# Patient Record
Sex: Female | Born: 1965 | Race: Black or African American | Hispanic: No | Marital: Married | Smoking: Never smoker
Health system: Southern US, Community
[De-identification: ages and names within clinical notes are randomized; demographics above are authoritative.]

## PROBLEM LIST (undated history)

## (undated) DIAGNOSIS — J189 Pneumonia, unspecified organism: Secondary | ICD-10-CM

## (undated) DIAGNOSIS — T8859XA Other complications of anesthesia, initial encounter: Secondary | ICD-10-CM

## (undated) DIAGNOSIS — M1990 Unspecified osteoarthritis, unspecified site: Secondary | ICD-10-CM

## (undated) DIAGNOSIS — G43909 Migraine, unspecified, not intractable, without status migrainosus: Secondary | ICD-10-CM

## (undated) DIAGNOSIS — E119 Type 2 diabetes mellitus without complications: Secondary | ICD-10-CM

## (undated) DIAGNOSIS — I10 Essential (primary) hypertension: Secondary | ICD-10-CM

## (undated) HISTORY — PX: TONSILLECTOMY: SUR1361

## (undated) HISTORY — PX: THYROID SURGERY: SHX805

## (undated) HISTORY — PX: TUBAL LIGATION: SHX77

---

## 1997-09-12 ENCOUNTER — Other Ambulatory Visit: Admission: RE | Admit: 1997-09-12 | Discharge: 1997-09-12 | Payer: Self-pay | Admitting: Obstetrics and Gynecology

## 1997-11-21 ENCOUNTER — Encounter: Admission: RE | Admit: 1997-11-21 | Discharge: 1997-11-21 | Payer: Self-pay | Admitting: *Deleted

## 1997-12-17 ENCOUNTER — Emergency Department (HOSPITAL_COMMUNITY): Admission: EM | Admit: 1997-12-17 | Discharge: 1997-12-17 | Payer: Self-pay

## 2002-06-29 ENCOUNTER — Emergency Department (HOSPITAL_COMMUNITY): Admission: EM | Admit: 2002-06-29 | Discharge: 2002-06-29 | Payer: Self-pay | Admitting: Emergency Medicine

## 2004-05-08 ENCOUNTER — Encounter (HOSPITAL_COMMUNITY): Admission: RE | Admit: 2004-05-08 | Discharge: 2004-08-06 | Payer: Self-pay | Admitting: Family Medicine

## 2004-05-20 ENCOUNTER — Ambulatory Visit: Payer: Self-pay | Admitting: "Endocrinology

## 2004-05-29 ENCOUNTER — Ambulatory Visit (HOSPITAL_COMMUNITY): Admission: RE | Admit: 2004-05-29 | Discharge: 2004-05-29 | Payer: Self-pay | Admitting: "Endocrinology

## 2004-05-29 ENCOUNTER — Encounter (INDEPENDENT_AMBULATORY_CARE_PROVIDER_SITE_OTHER): Payer: Self-pay | Admitting: *Deleted

## 2004-05-29 IMAGING — US US BIOPSY
1 series · 12 of 12 positions shown · non-contrast
Comparison: none

CLINICAL DATA: Multiple nodules within the right thyroid found on ultrasound films from [HOSPITAL].  Gradual weight gain.  
 ULTRASOUND GUIDED THYROID BIOPSY:

[Series 1: unknown · 0.09mm/px · 12 of 12 slices shown]
[im 1/12]
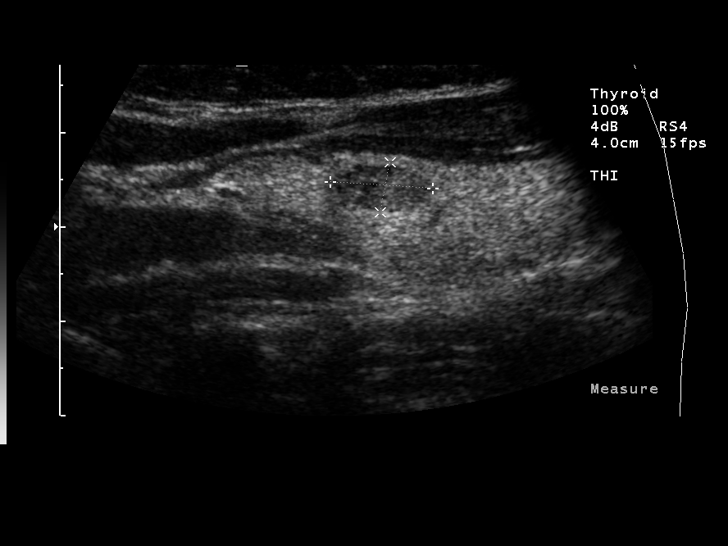
[im 2/12]
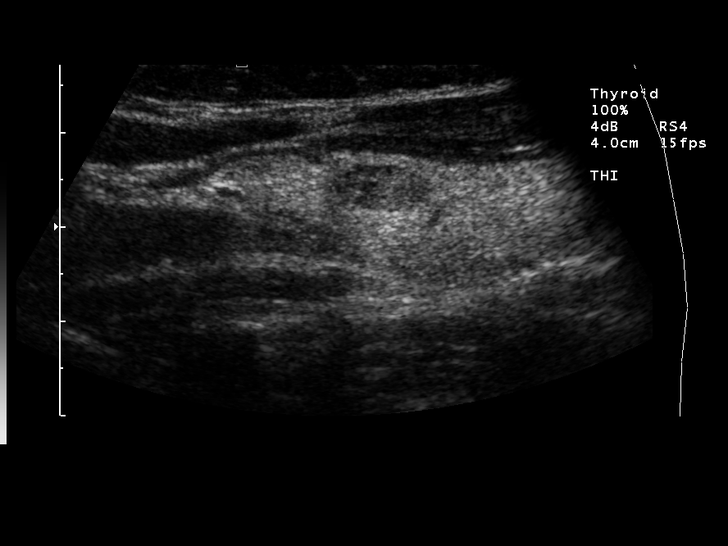
[im 3/12]
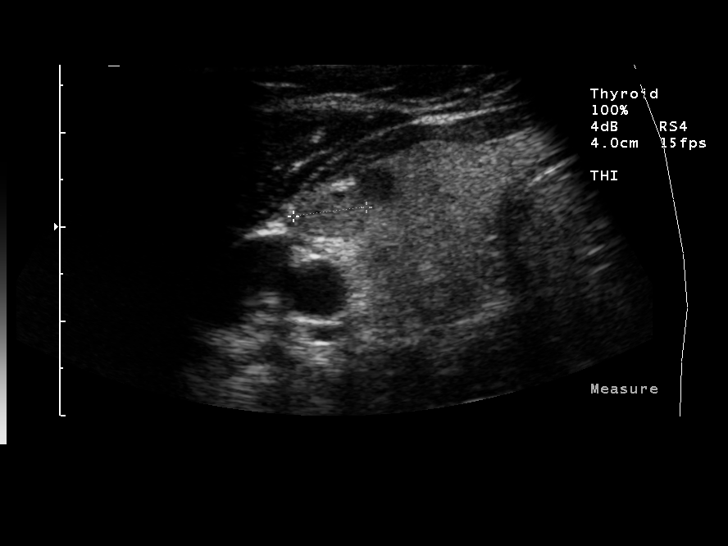
[im 4/12]
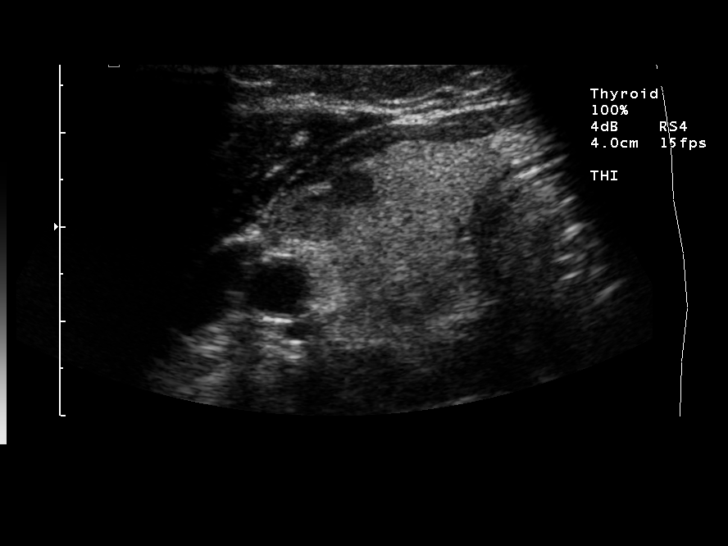
[im 5/12]
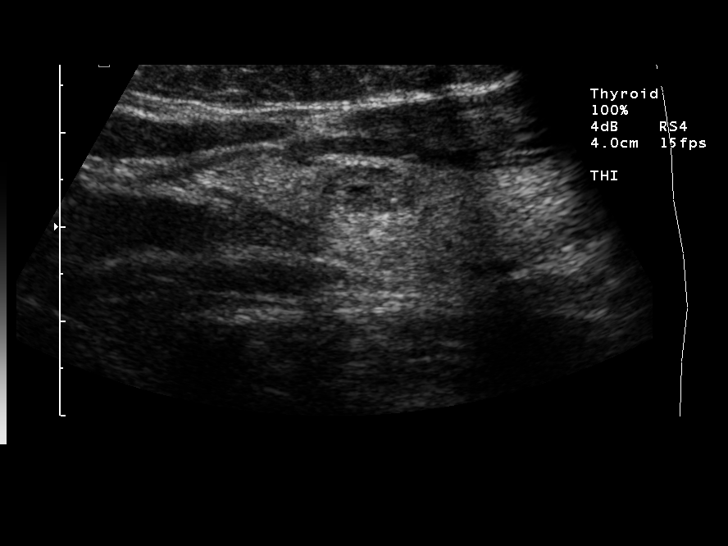
[im 6/12]
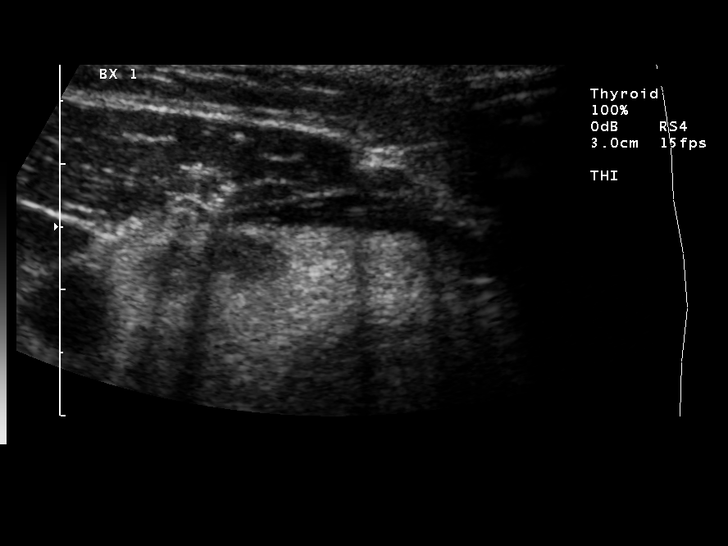
[im 7/12]
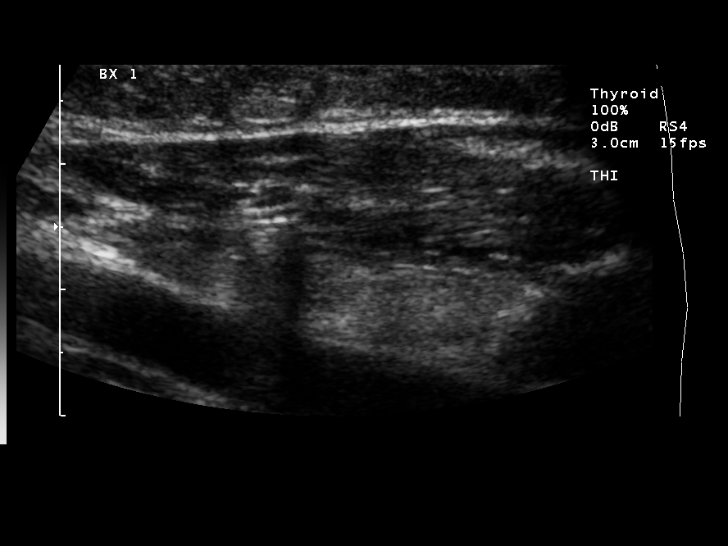
[im 8/12]
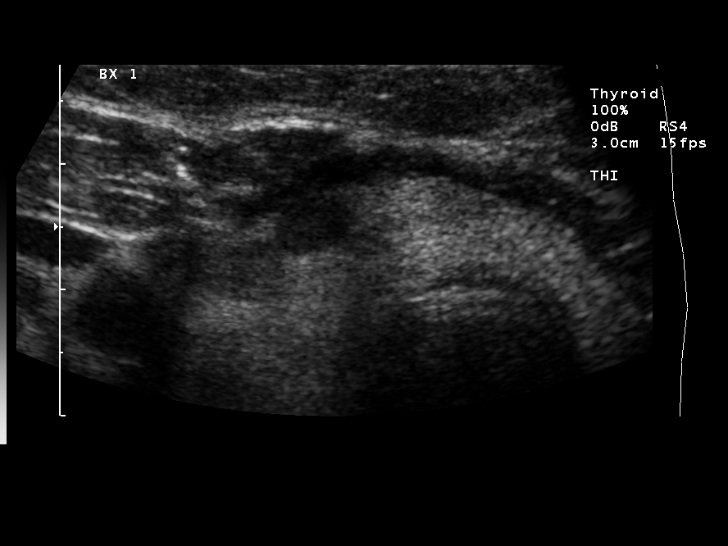
[im 9/12]
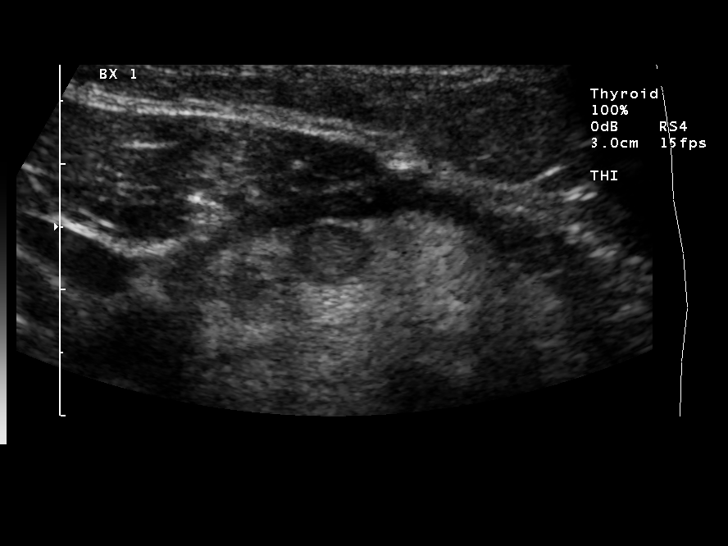
[im 10/12]
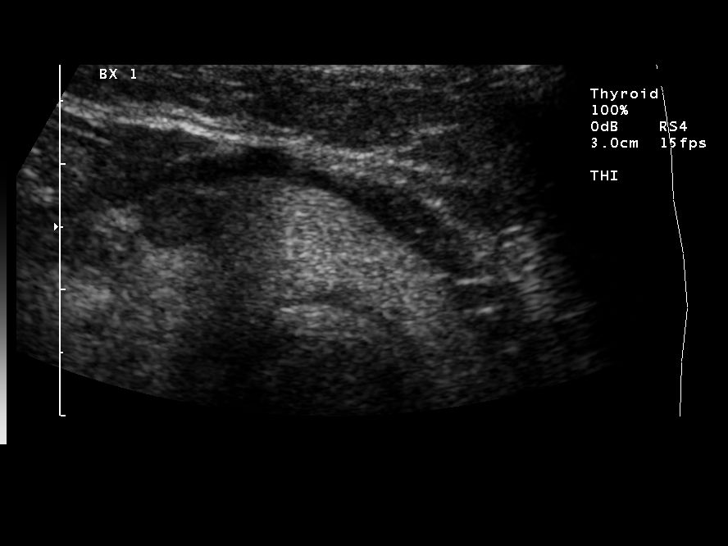
[im 11/12]
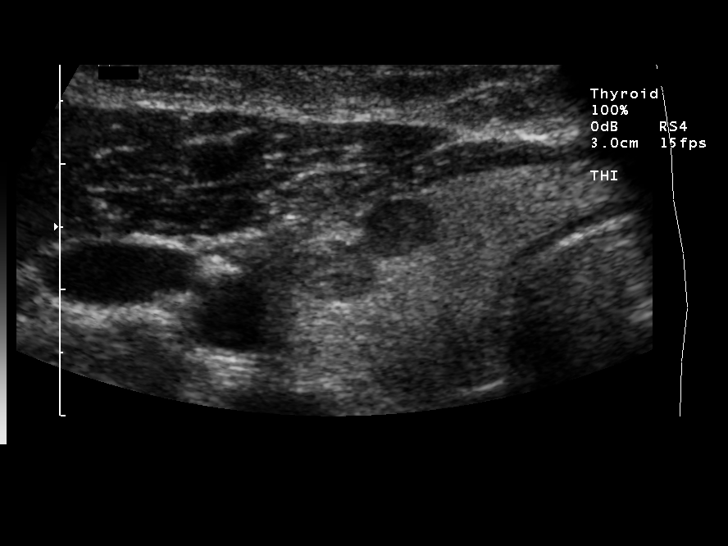
[im 12/12]
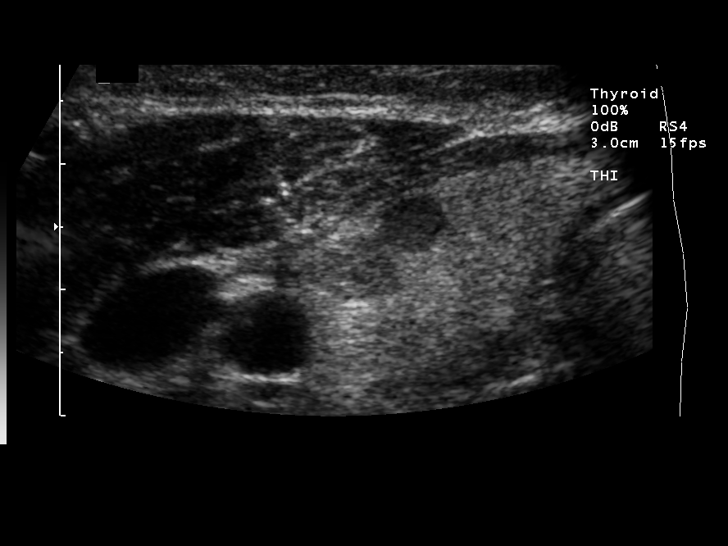

[12 of 12 positions shown; findings below may reference images not displayed]

FINDINGS: An ultrasound guided thyroid biopsy was thoroughly discussed with the patient and questions were answered.  Risks and benefits of the procedure were also discussed.  Risks specifically discussed included bleeding, bruising, infection and risk of injury to adjacent blood vessels and nerves.  The patient understands and wishes to proceed.  Verbal and written consent was obtained. 
 After the patient was prepped and draped in the normal sterile fashion, 1 percent lidocaine was used for local anesthesia.  Using direct ultrasound guidance, give passes were made using a 25-gauge hypodermic needle into the dominant nodule within the right lobe of the thyroid.  The specimens were given to cytology for further analysis.  The patient tolerated the procedure well and there were no immediate complications.  No hematoma was identified postprocedure.  Radiologist present for the procedure was Dr. MONTXO.
IMPRESSION: Successful ultrasound guided fine needle aspiration of the right lobe of the thyroid.

## 2004-09-30 ENCOUNTER — Ambulatory Visit: Payer: Self-pay | Admitting: "Endocrinology

## 2004-10-30 ENCOUNTER — Ambulatory Visit (HOSPITAL_COMMUNITY): Admission: RE | Admit: 2004-10-30 | Discharge: 2004-10-30 | Payer: Self-pay | Admitting: "Endocrinology

## 2005-01-06 ENCOUNTER — Other Ambulatory Visit: Admission: RE | Admit: 2005-01-06 | Discharge: 2005-01-06 | Payer: Self-pay | Admitting: Family Medicine

## 2005-03-31 ENCOUNTER — Ambulatory Visit: Payer: Self-pay | Admitting: "Endocrinology

## 2005-07-06 ENCOUNTER — Ambulatory Visit: Payer: Self-pay | Admitting: "Endocrinology

## 2005-07-07 ENCOUNTER — Encounter: Admission: RE | Admit: 2005-07-07 | Discharge: 2005-07-07 | Payer: Self-pay | Admitting: "Endocrinology

## 2005-07-07 IMAGING — US US SOFT TISSUE HEAD/NECK
1 series · 13 of 25 positions shown · non-contrast
Comparison: This is correlated with nuclear medicine thyroid scan dated [DATE] and also previous thyroid ultrasound from [HOSPITAL] dated [DATE].

CLINICAL DATA: Attention thyroid nodule.
 THYROID ULTRASOUND:
TECHNIQUE: Ultrasound examination of the thyroid gland and adjacent soft tissue structures was performed.

[Series 1: unknown · 0.09mm/px · 13 of 33 slices shown]
[im 1/33]
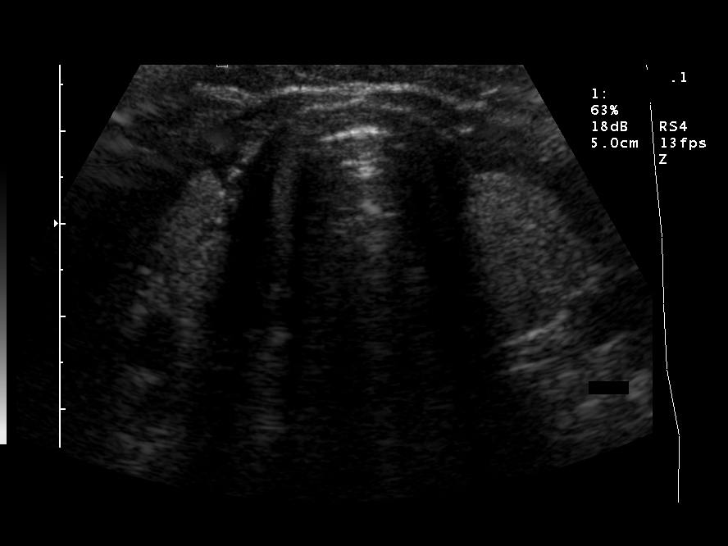
[im 3/33]
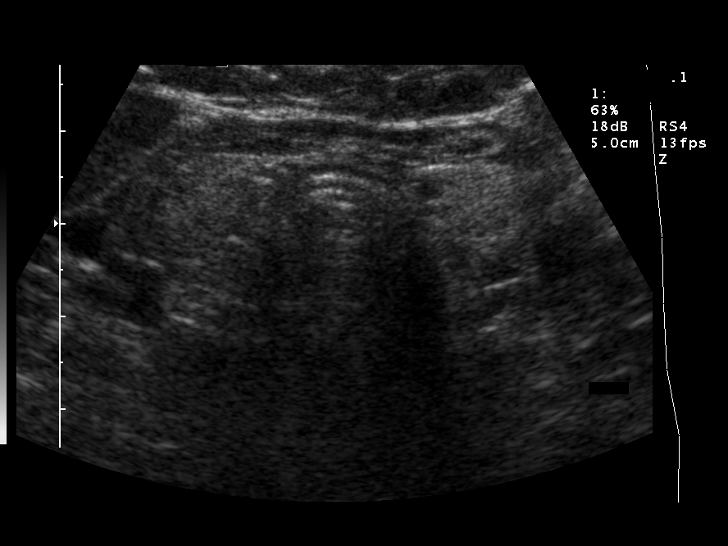
[im 6/33]
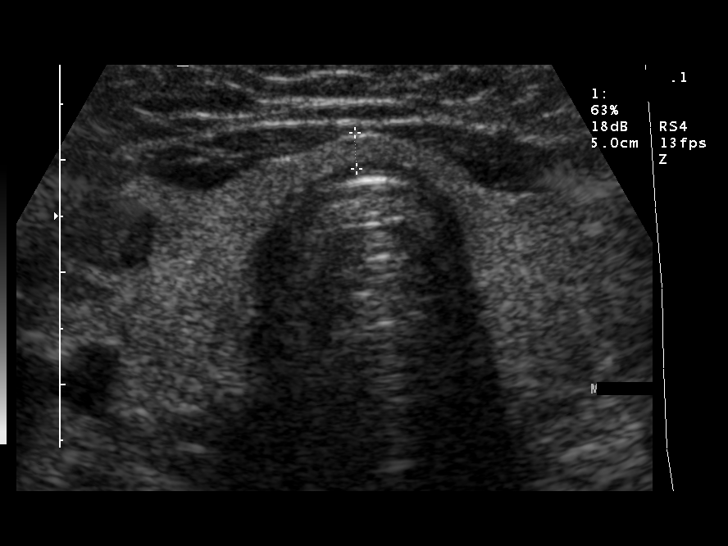
[im 9/33]
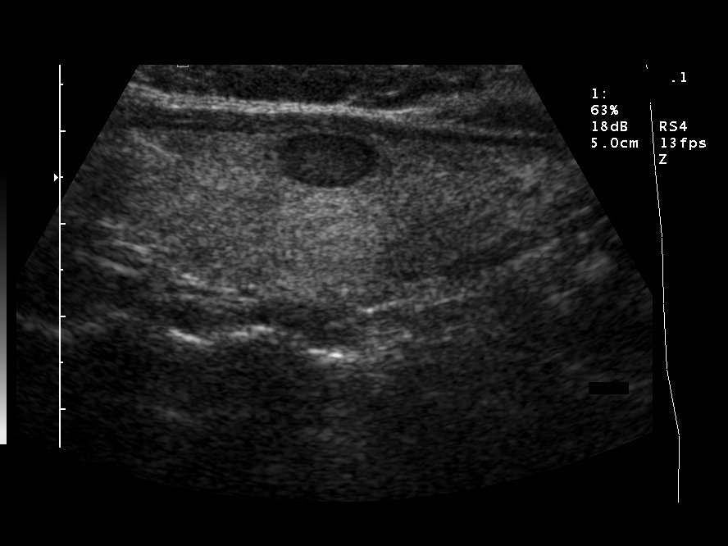
[im 11/33]
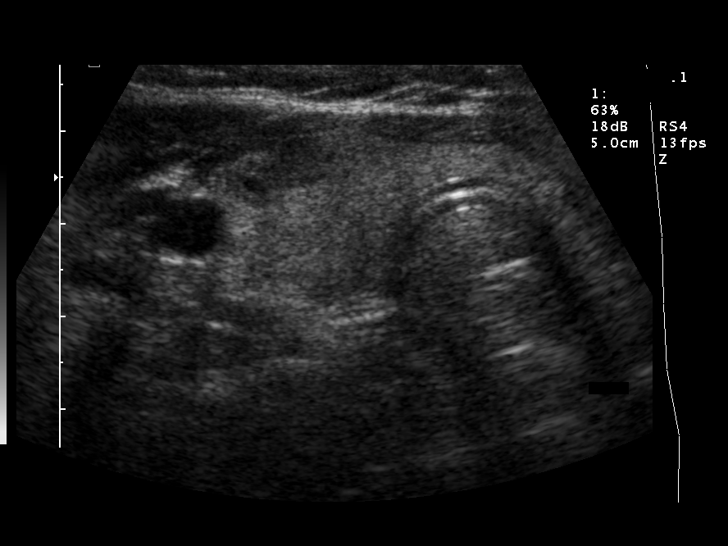
[im 14/33]
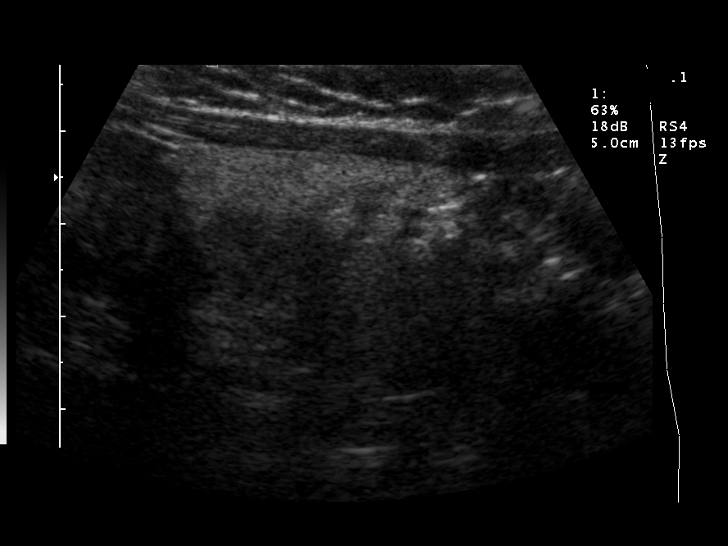
[im 17/33]
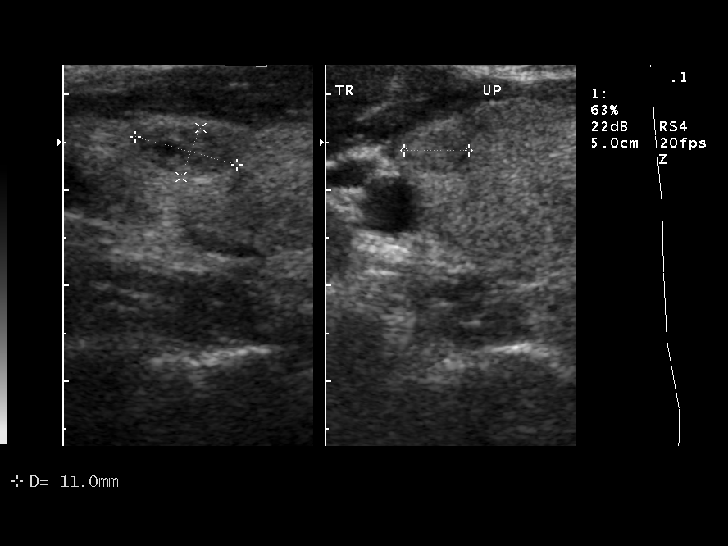
[im 19/33]
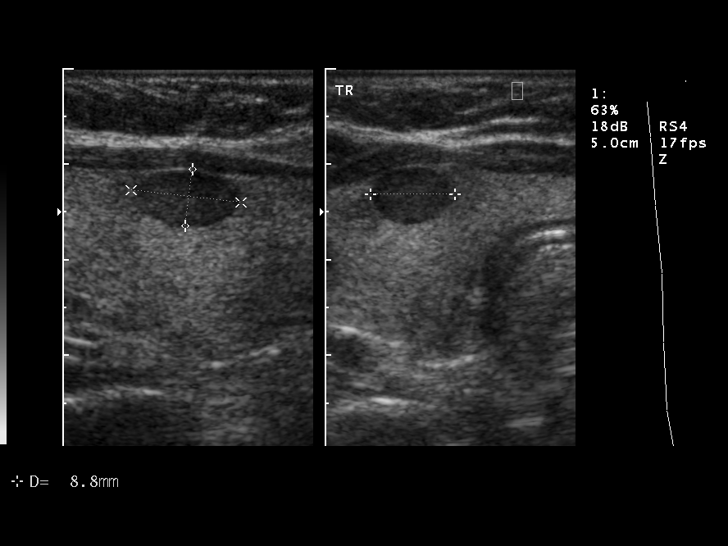
[im 22/33]
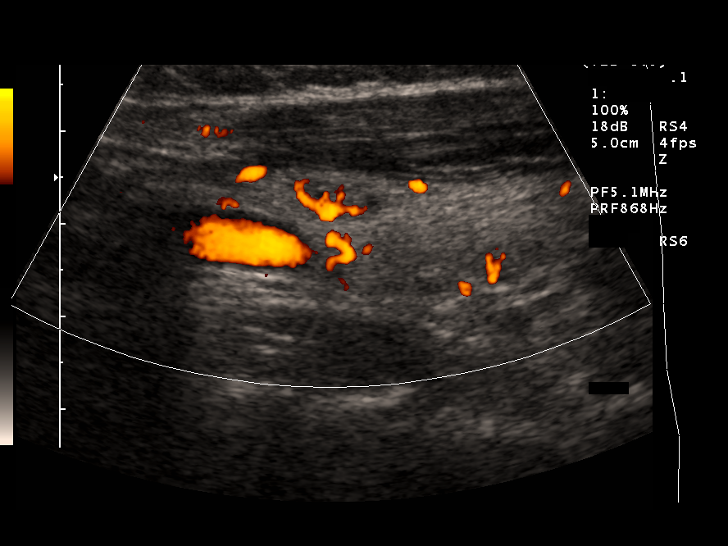
[im 25/33]
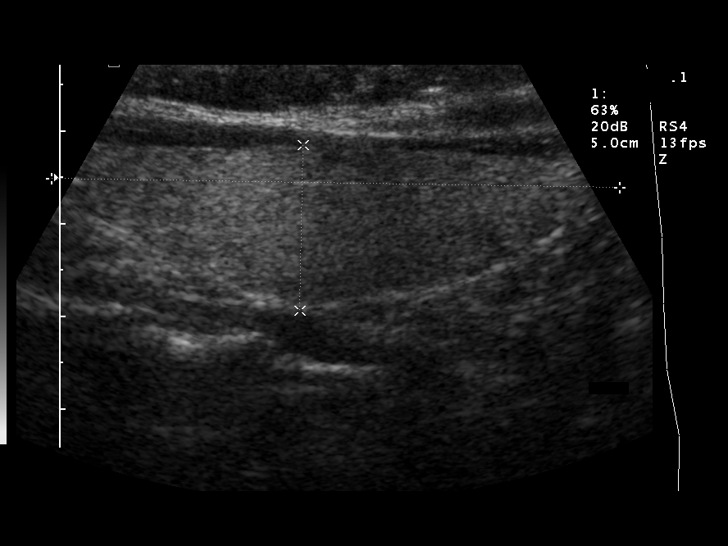
[im 27/33]
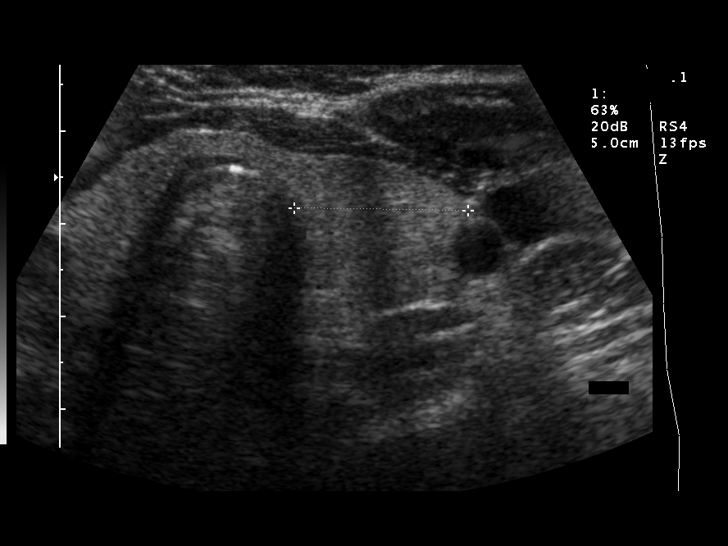
[im 30/33]
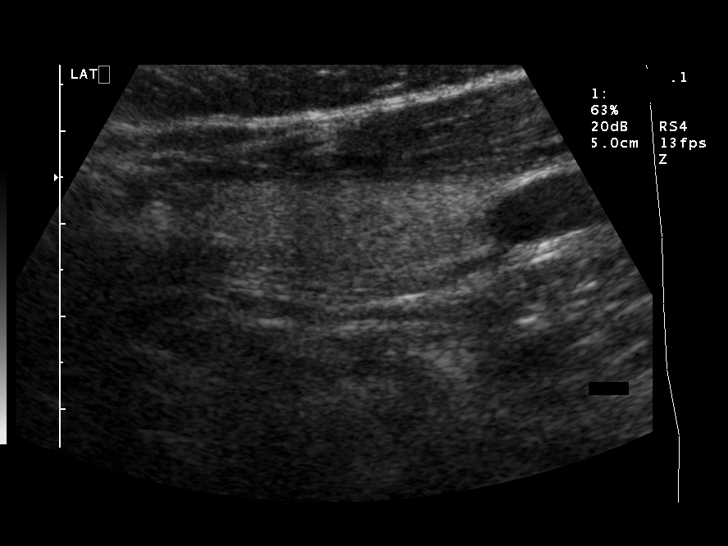
[im 33/33]
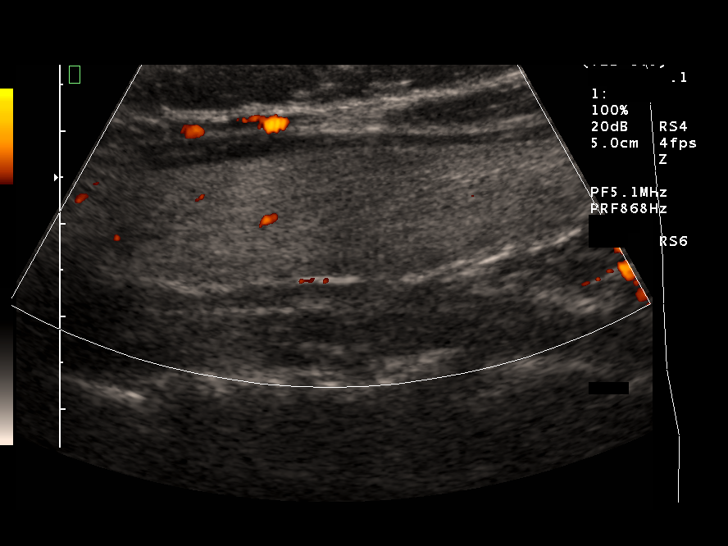

[13 of 25 positions shown; findings below may reference images not displayed]

FINDINGS: The right lobe of the thyroid measures 6.0 x 2.1 x 2.1 cm in size and the left lobe 6.1 x 1.8 x 1.9 cm in size.  The isthmus measures 3 mm in AP dimension.  There are two nodules associated with the right lobe of the thyroid.  These are adjacent to one another.  The more medially located nodule measures 1.2 x .6 x .9 cm in size and the more laterally located nodule 1.1 x .6 x .7 cm in size.  These have slightly increased in size intervally.  There is no nodule within the left lobe of the thyroid.  The small nodule seen associated with the isthmus slightly to the left of the midline on the prior study is not definitely seen on today?s study.  When correlating the ultrasound study with the recent nuclear medicine examination the area of increased activity is associated with the more laterally located solid nodule within the mid to upper pole portion of the right lobe of the thyroid.
IMPRESSION: Two solid nodules associated with the upper pole of the right lobe of the thyroid which have slightly increased in size intervally.  The more laterally located nodule is associated with increased activity on the nuclear medicine thyroid scan as discussed above.

## 2005-08-24 ENCOUNTER — Ambulatory Visit: Payer: Self-pay | Admitting: "Endocrinology

## 2005-08-27 ENCOUNTER — Encounter: Admission: RE | Admit: 2005-08-27 | Discharge: 2005-08-27 | Payer: Self-pay | Admitting: "Endocrinology

## 2005-09-21 ENCOUNTER — Other Ambulatory Visit: Admission: RE | Admit: 2005-09-21 | Discharge: 2005-09-21 | Payer: Self-pay | Admitting: Interventional Radiology

## 2005-09-21 ENCOUNTER — Encounter (INDEPENDENT_AMBULATORY_CARE_PROVIDER_SITE_OTHER): Payer: Self-pay | Admitting: Specialist

## 2005-09-21 ENCOUNTER — Encounter: Admission: RE | Admit: 2005-09-21 | Discharge: 2005-09-21 | Payer: Self-pay | Admitting: "Endocrinology

## 2005-09-21 IMAGING — US US BIOPSY
1 series · 10 of 10 positions shown · non-contrast
Comparison: none

CLINICAL DATA: History of right upper pole thyroid lobe nodules with previous aspiration in this area on [DATE] which revealed changes consistent with a nonneoplastic goiter.  Recent thyroid ultrasound on [DATE] revealed a slight interval increase in the size of the these two nodules.  The lateral right upper pole thyroid lobe nodule measures 1.1 x 0.6 x 0.7 cm.  The more medially located right upper pole thyroid nodule measures 1.2 x 0.6 x 0.9 cm.  In addition, the more laterally   located nodule is associated with increased activity on nuclear medicine thyroid scan performed on [DATE].    Request is now made for fine needle aspiration of both right lateral and medial upper pole thyroid lobe nodules. 
FINE NEEDLE ASPIRATION OF RIGHT LATERAL UPPER POLE THYROID LOBE NODULE:

[Series 1: unknown · 0.06mm/px · 10 of 10 slices shown]
[im 1/10]
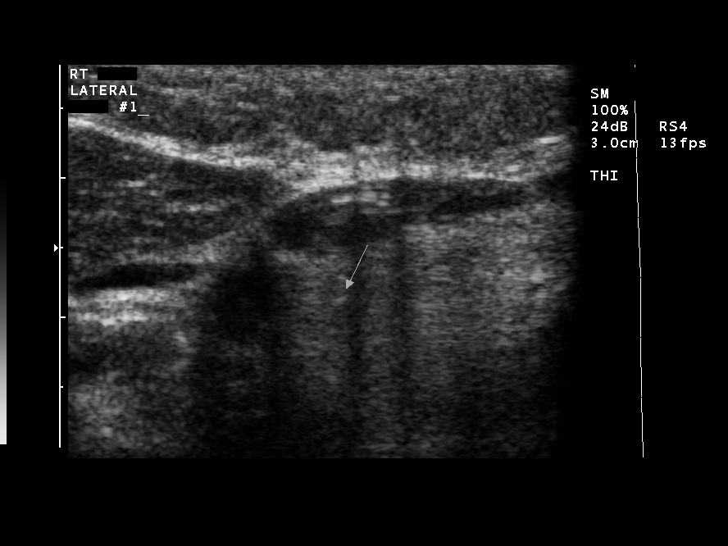
[im 2/10]
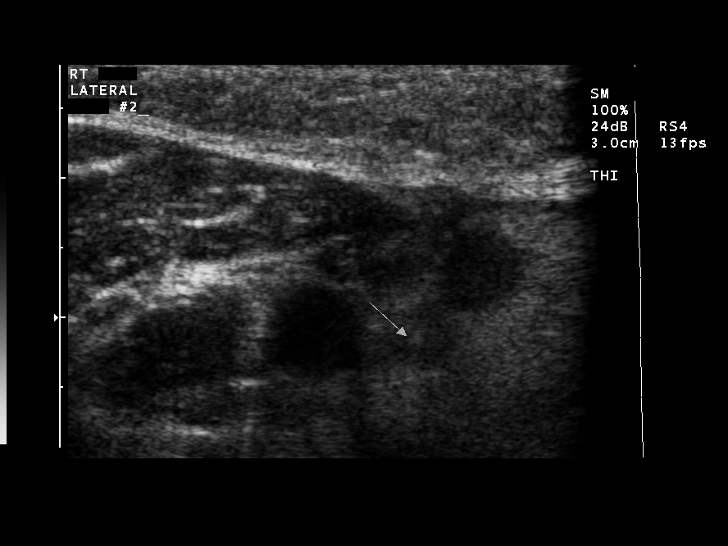
[im 3/10]
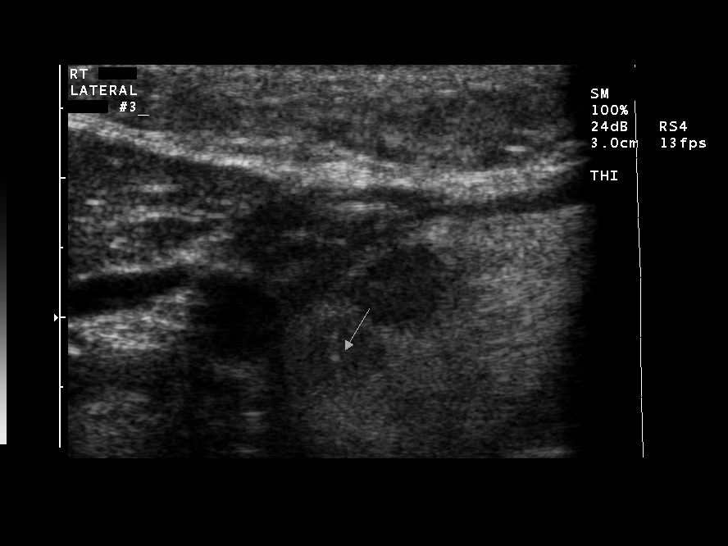
[im 4/10]
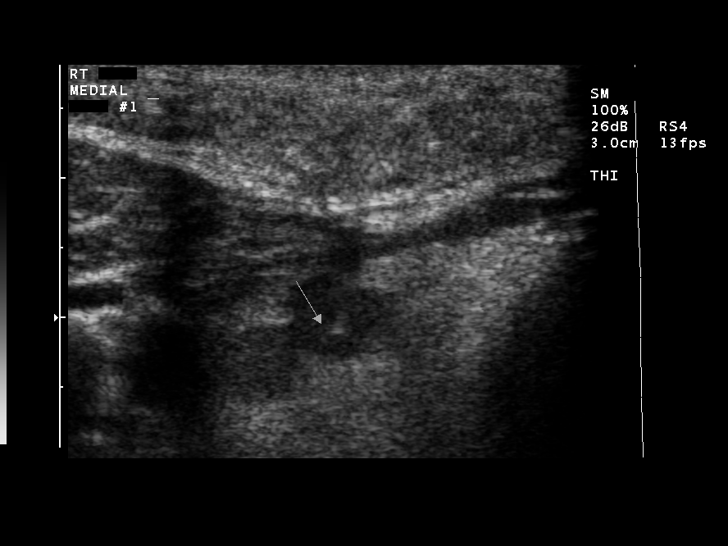
[im 5/10]
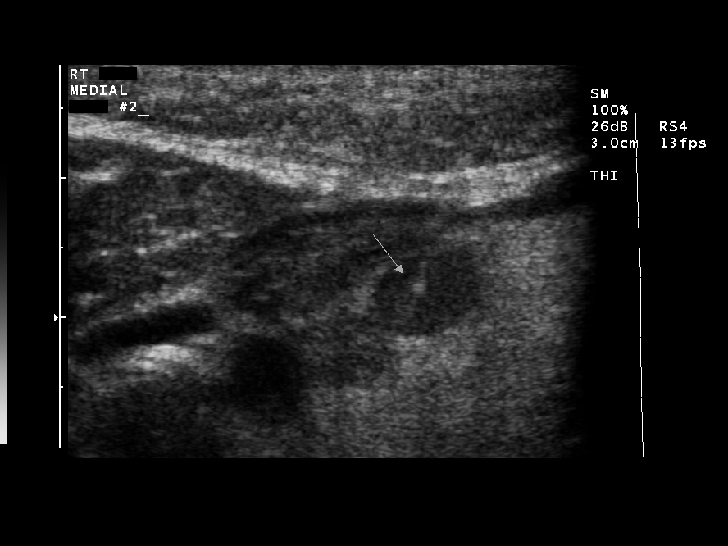
[im 6/10]
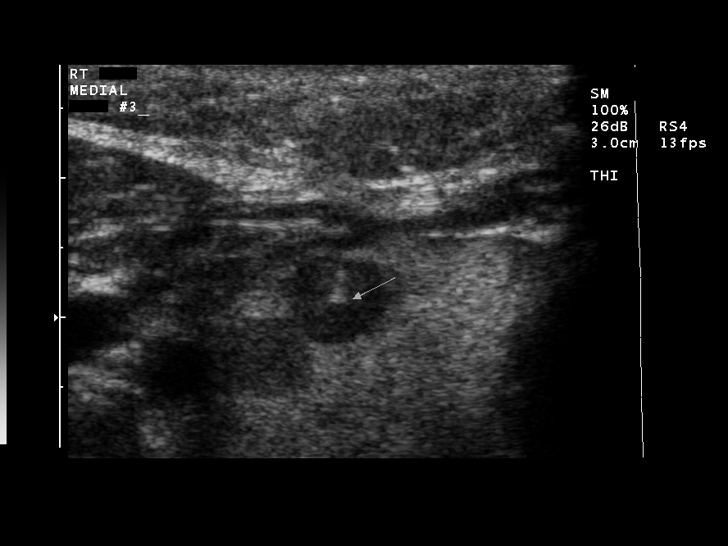
[im 7/10]
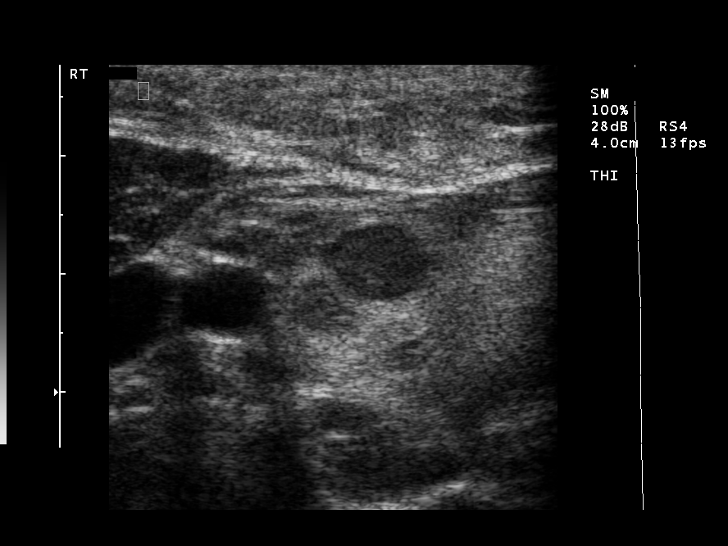
[im 8/10]
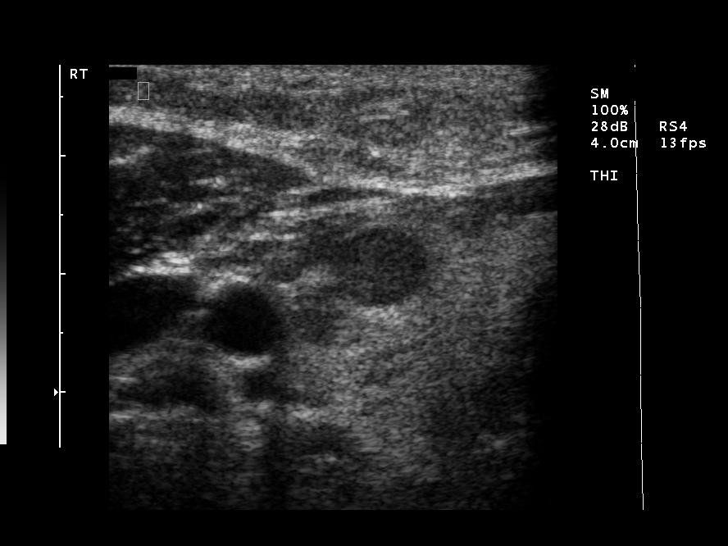
[im 9/10]
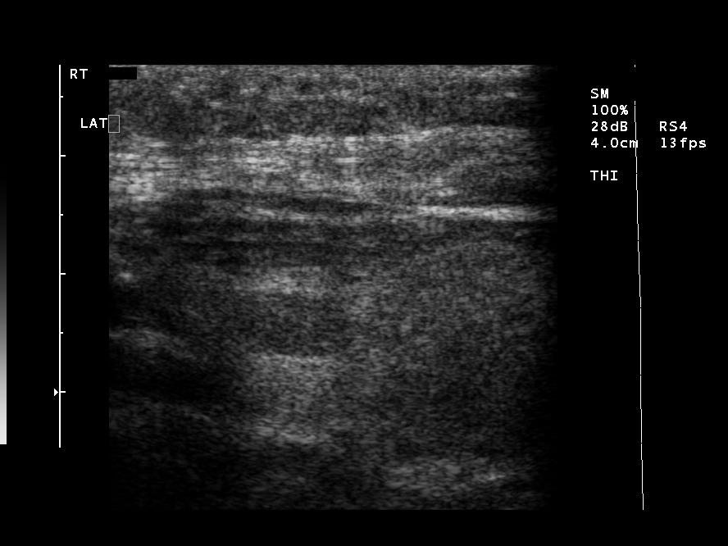
[im 10/10]
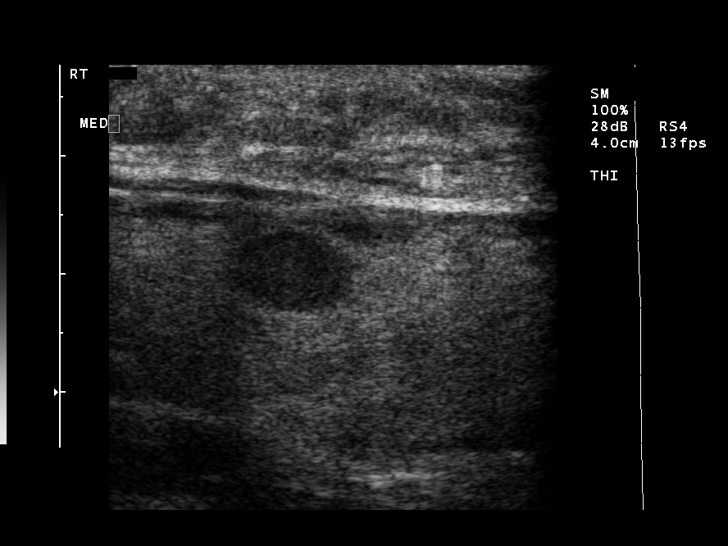

[10 of 10 positions shown; findings below may reference images not displayed]

FINDINGS: The above procedure was thoroughly discussed with the patient and written informed consent was obtained.
Ultrasound was then performed to localize and mark an adequate site for the biopsy.  The patient was then prepped and draped in a normal sterile fashion, and  1% Lidocaine was used for local anesthesia.   direct ultrasound guidance, three passes were made using  25 gauge hypodermic needles into the nodule located within the  lateral upper pole of the right thyroid lobe.  Ultrasound confirmed placement of the needle on all three occasions.  The specimens were given to pathology for further analysis.  Post procedure imaging demonstrated no hematoma or immediate complication. The patient tolerated the procedure well.
IMPRESSION: Successful ultrasound guided fine needle aspiration, nodule right lateral upper pole, right lobe of the thyroid.  Final pathology pending. 
FINE NEEDLE ASPIRATION OF RIGHT MEDIAL UPPER POLE THYROID LOBE NODULE:
FINDINGS: The above procedure was thoroughly discussed with the patient and written informed consent was obtained.
Ultrasound was then performed to localize and mark an adequate site for the biopsy.  The patient was then prepped and draped in a normal sterile fashion, and 1% Lidocaine was used for local anesthesia.   direct ultrasound guidance, three passes were made using  25 gauge hypodermic needles into the nodule located within the  medial upper pole of the right thyroid lobe.  Ultrasound confirmed placement of the needle on all three occasions.  The specimens were given to pathology for further analysis.  Post procedure imaging demonstrated no hematoma or immediate complication. The patient tolerated the procedure well.
IMPRESSION: Successful ultrasound guided fine needle aspiration, nodule right medial upper pole, right lobe of the thyroid.  Final pathology pending.

## 2006-01-15 IMAGING — CR DG CHEST 2V
2 series · 2 of 2 positions shown · non-contrast
Comparison: None.

CLINICAL DATA: Preop thyroid nodule.  
 CHEST - 2 VIEW:

[view not recorded (1 of 2)]
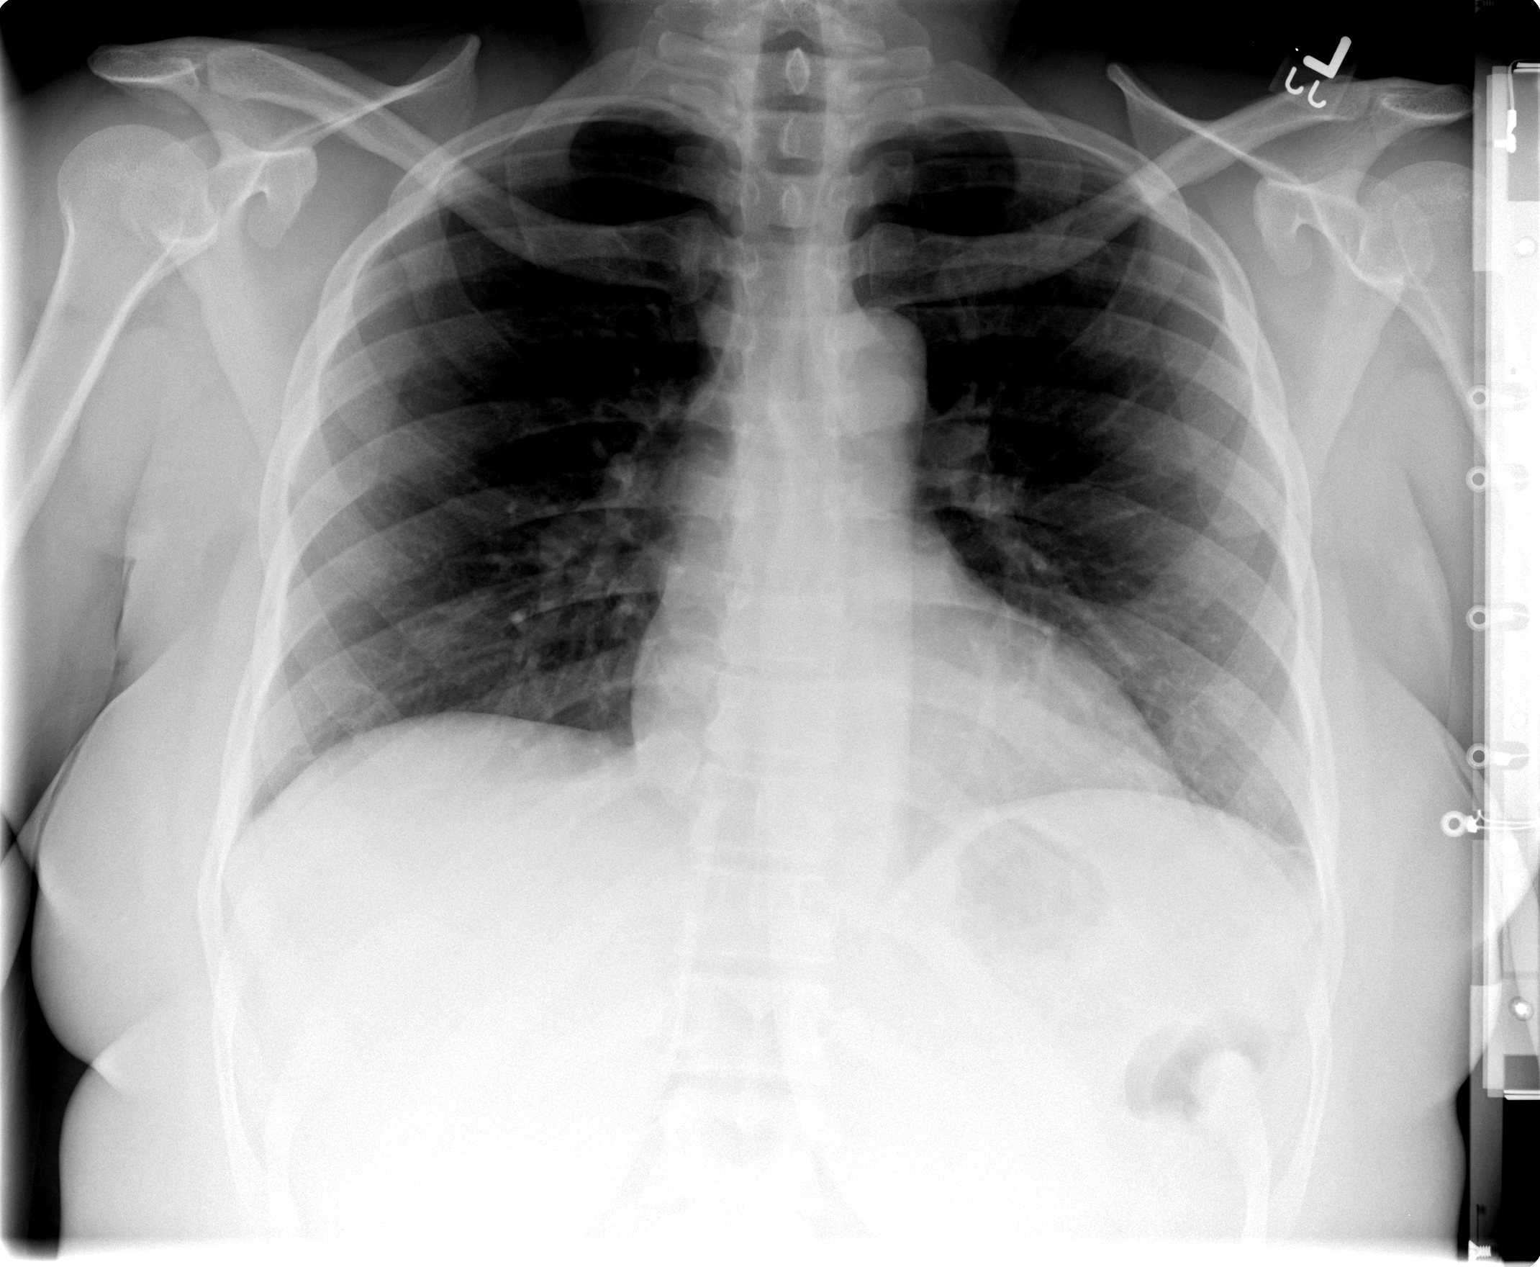

[view not recorded (2 of 2)]
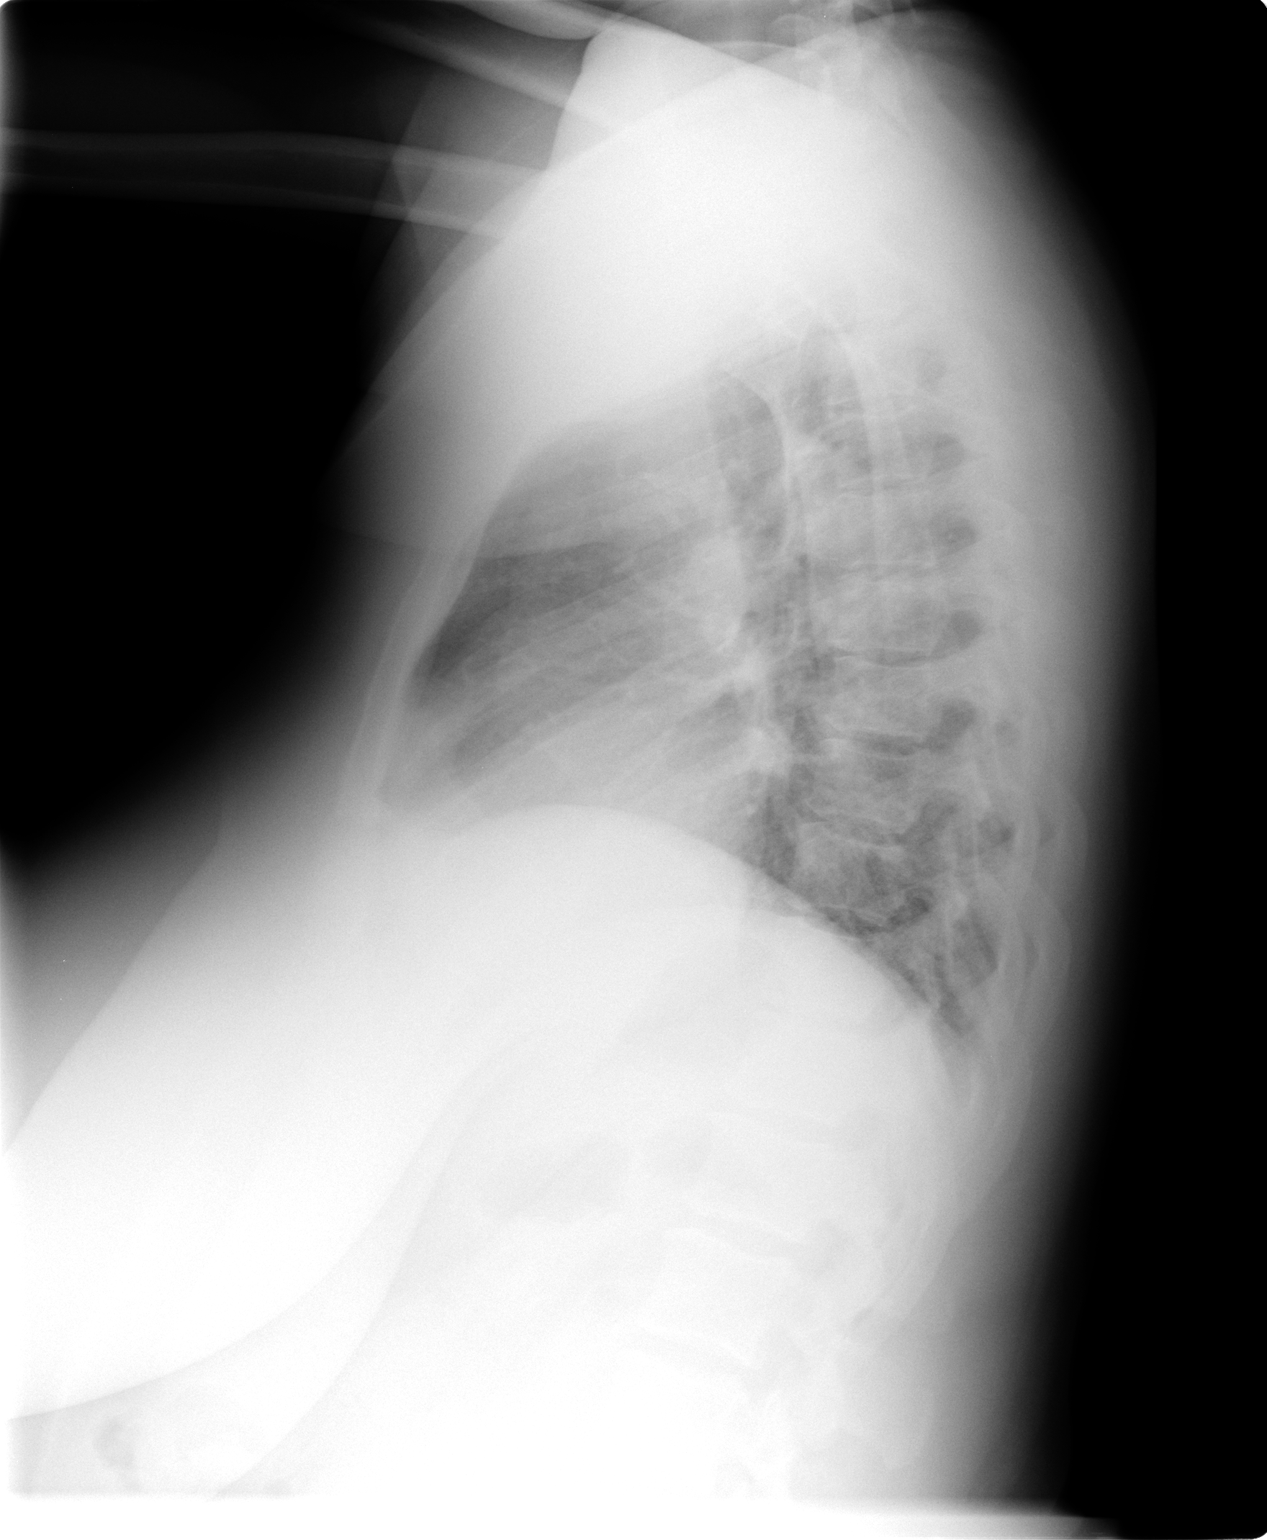

[2 of 2 positions shown; findings below may reference images not displayed]

FINDINGS: The heart is upper normal in size.  The vascularity is normal and the lungs are clear.
IMPRESSION: No acute abnormality.

## 2006-01-18 ENCOUNTER — Ambulatory Visit (HOSPITAL_COMMUNITY): Admission: RE | Admit: 2006-01-18 | Discharge: 2006-01-19 | Payer: Self-pay | Admitting: Surgery

## 2006-01-18 ENCOUNTER — Encounter (INDEPENDENT_AMBULATORY_CARE_PROVIDER_SITE_OTHER): Payer: Self-pay | Admitting: *Deleted

## 2006-02-18 ENCOUNTER — Ambulatory Visit: Payer: Self-pay | Admitting: "Endocrinology

## 2006-05-20 ENCOUNTER — Ambulatory Visit: Payer: Self-pay | Admitting: "Endocrinology

## 2006-06-28 ENCOUNTER — Other Ambulatory Visit: Admission: RE | Admit: 2006-06-28 | Discharge: 2006-06-28 | Payer: Self-pay | Admitting: Family Medicine

## 2007-09-13 ENCOUNTER — Ambulatory Visit: Payer: Self-pay | Admitting: "Endocrinology

## 2007-12-17 ENCOUNTER — Inpatient Hospital Stay (HOSPITAL_COMMUNITY): Admission: AD | Admit: 2007-12-17 | Discharge: 2007-12-18 | Payer: Self-pay | Admitting: Internal Medicine

## 2007-12-17 ENCOUNTER — Encounter: Payer: Self-pay | Admitting: Emergency Medicine

## 2007-12-17 IMAGING — CR DG CHEST 1V PORT
1 series · 1 of 1 positions shown · non-contrast
Comparison: [DATE]

CLINICAL DATA: Chest pain

CHEST - 1 VIEW

[view not recorded]
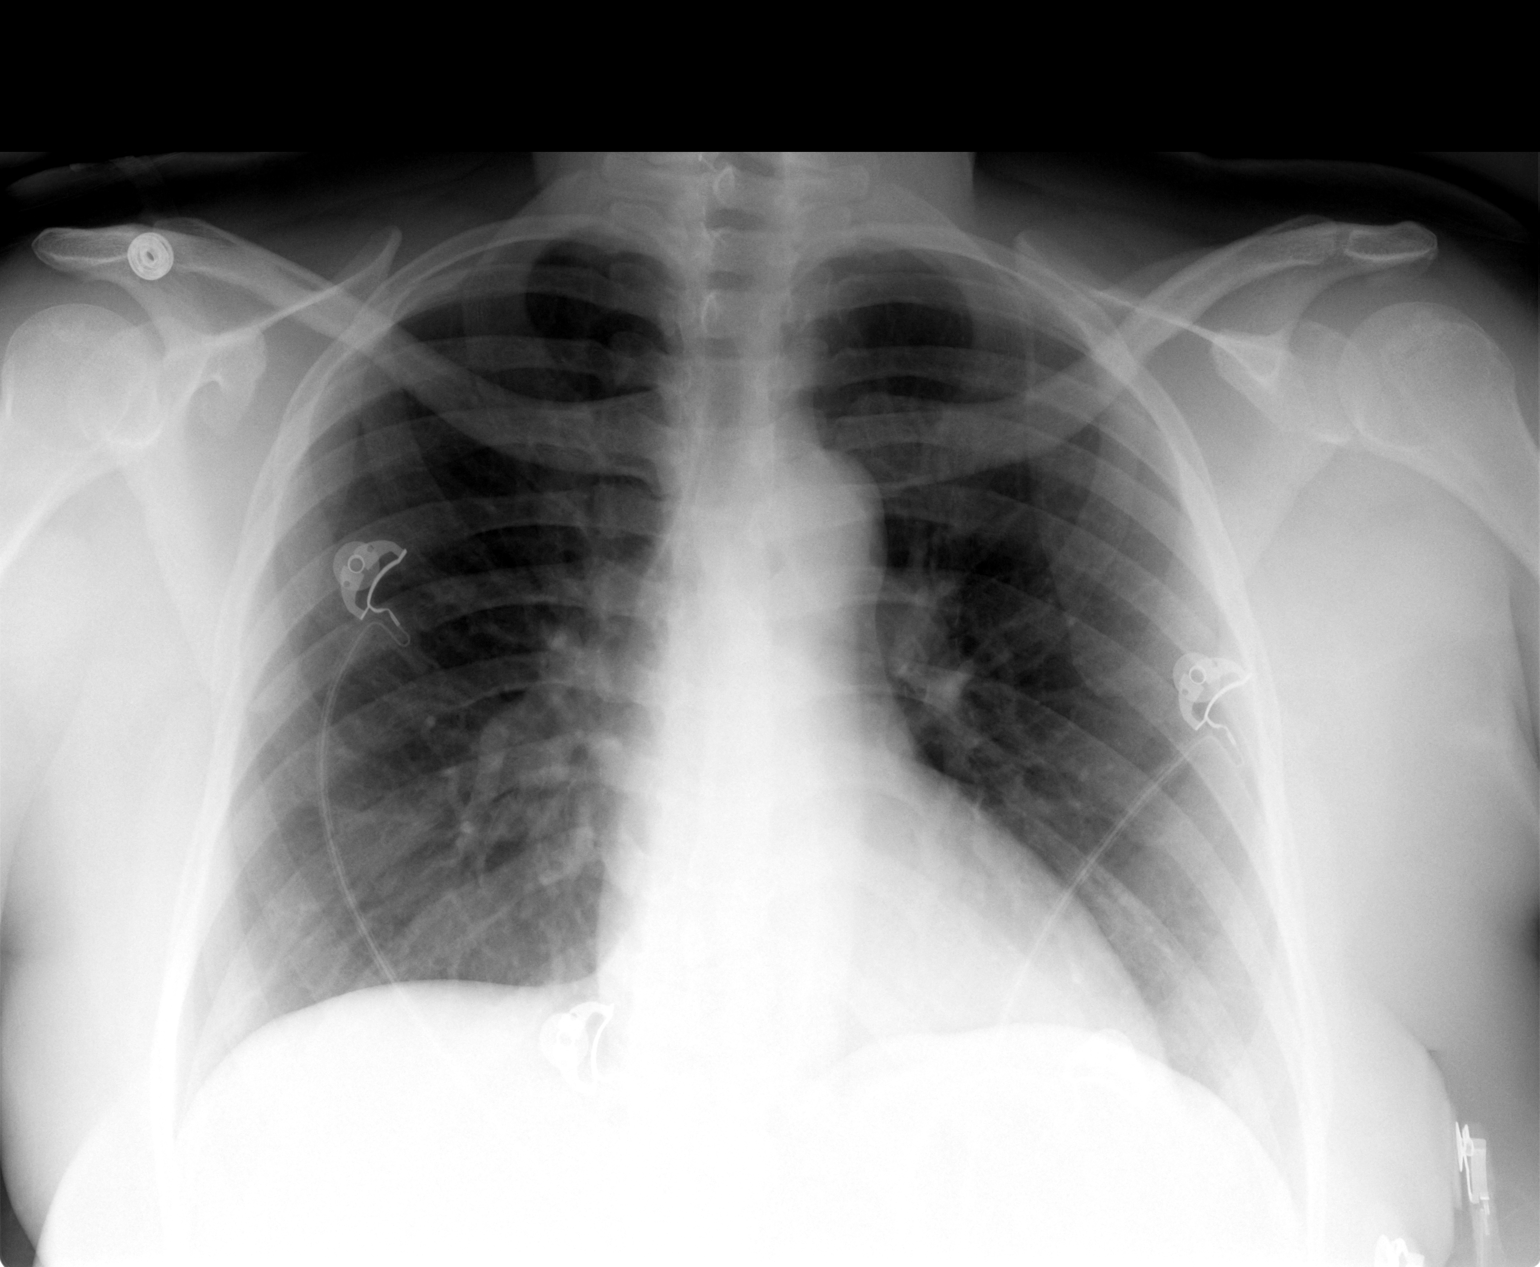

[1 of 1 positions shown; findings below may reference images not displayed]

FINDINGS: The heart size and mediastinal contours are within normal
limits.  Both lungs are clear.
IMPRESSION: No active disease.

## 2008-11-23 ENCOUNTER — Ambulatory Visit: Payer: Self-pay | Admitting: Physician Assistant

## 2008-11-23 ENCOUNTER — Encounter: Payer: Self-pay | Admitting: Physician Assistant

## 2008-11-24 ENCOUNTER — Encounter: Payer: Self-pay | Admitting: Physician Assistant

## 2008-11-24 LAB — CONVERTED CEMR LAB

## 2008-11-26 ENCOUNTER — Encounter: Admission: RE | Admit: 2008-11-26 | Discharge: 2008-11-26 | Payer: Self-pay | Admitting: Obstetrics & Gynecology

## 2008-11-26 IMAGING — MG MM DIGITAL SCREENING
4 series · 4 of 4 positions shown · non-contrast
Comparison: Prior studies.

DG SCREEN MAMMOGRAM BILATERAL
Bilateral CC and MLO view(s) were taken.
Technologist: PETTIT

DIGITAL SCREENING MAMMOGRAM WITH CAD:

[R CC]
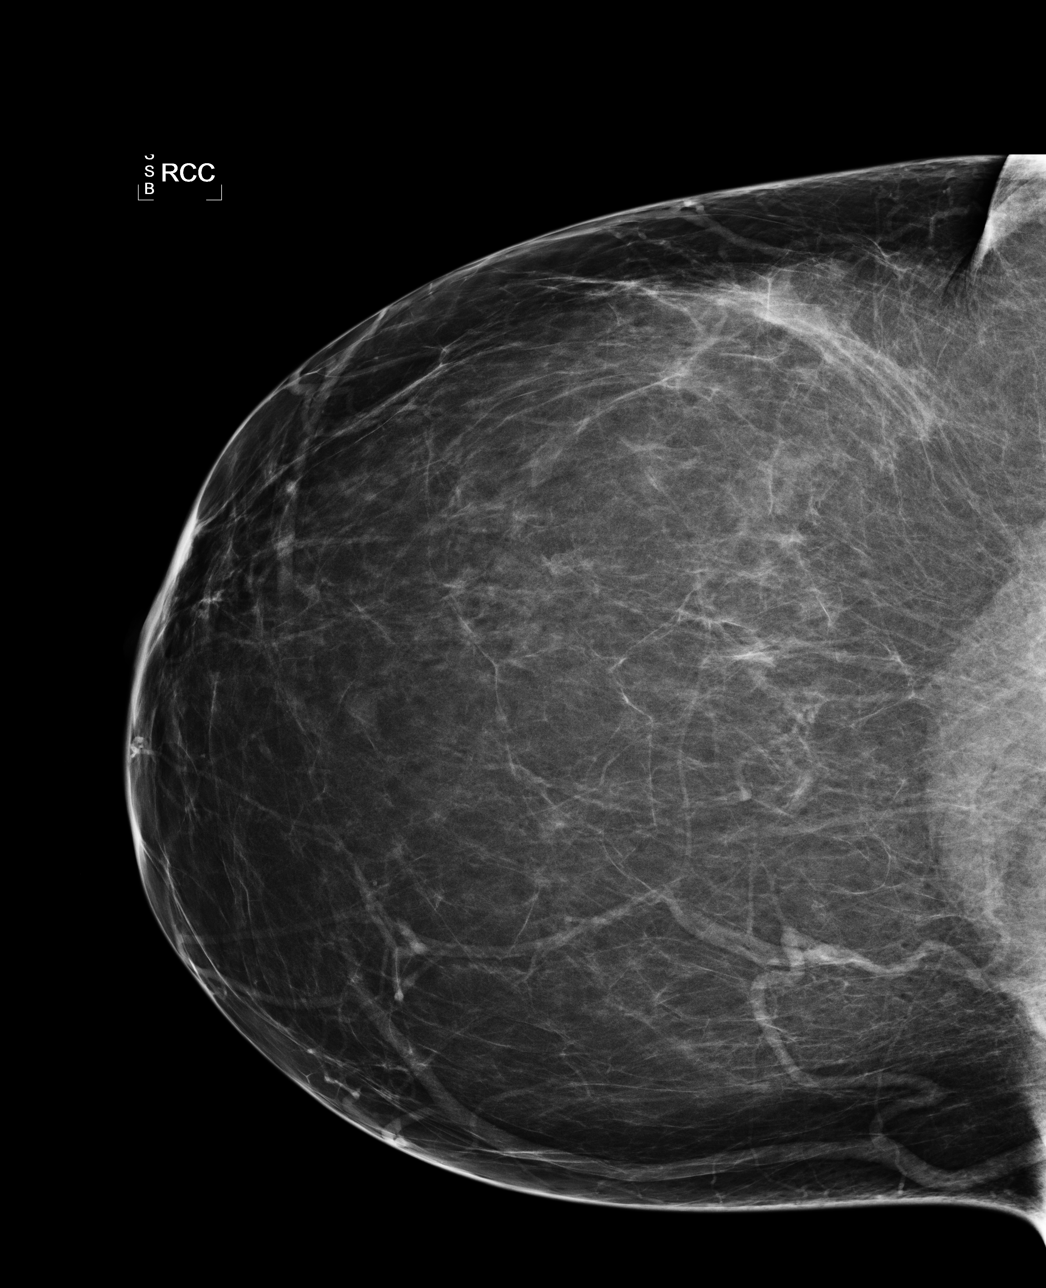

[L CC]
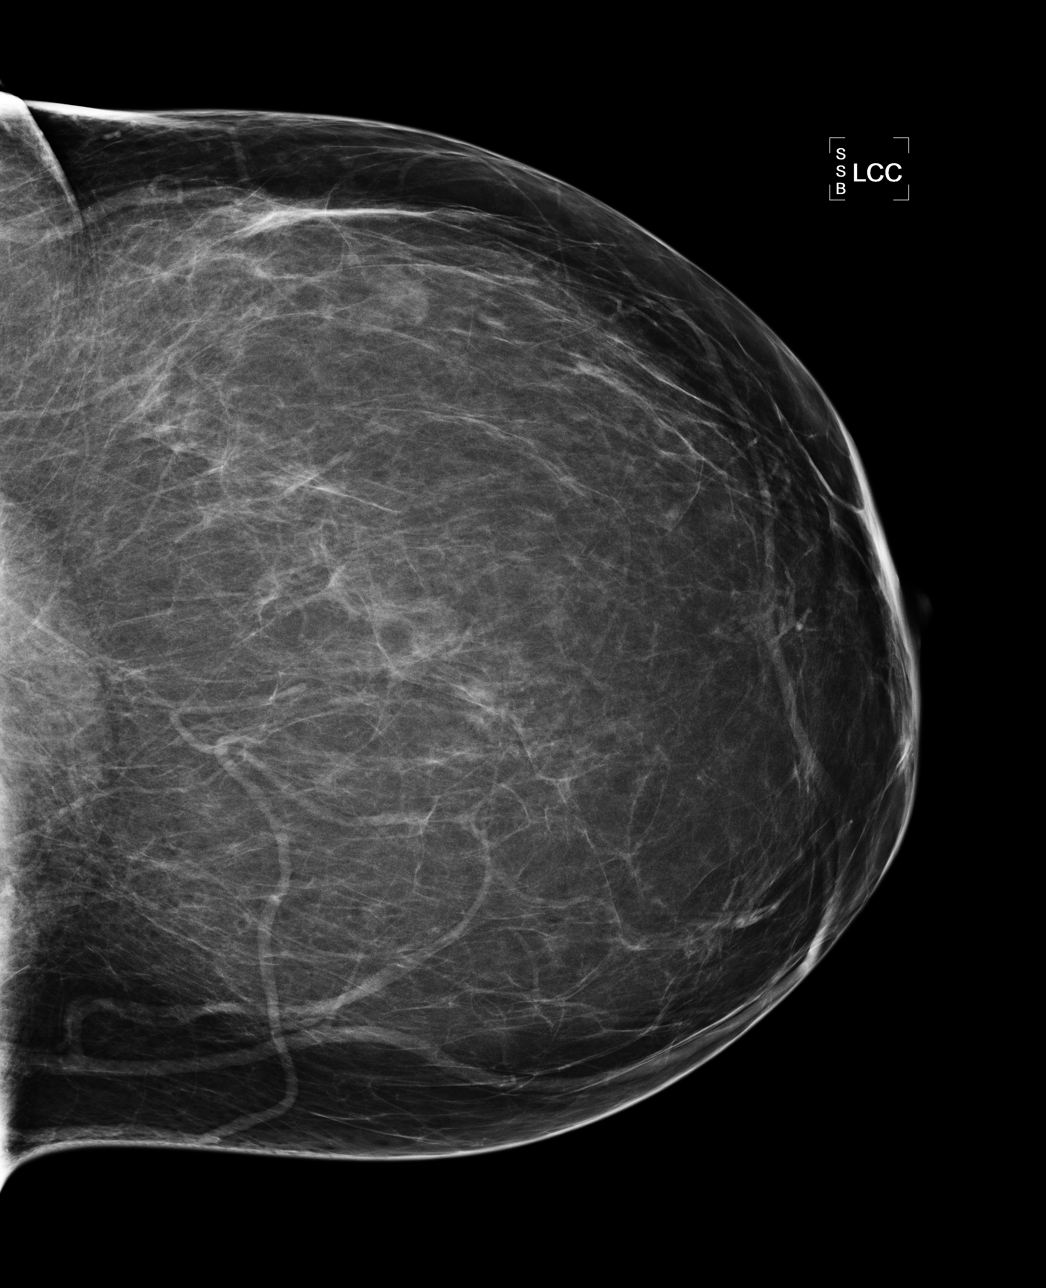

[L MLO]
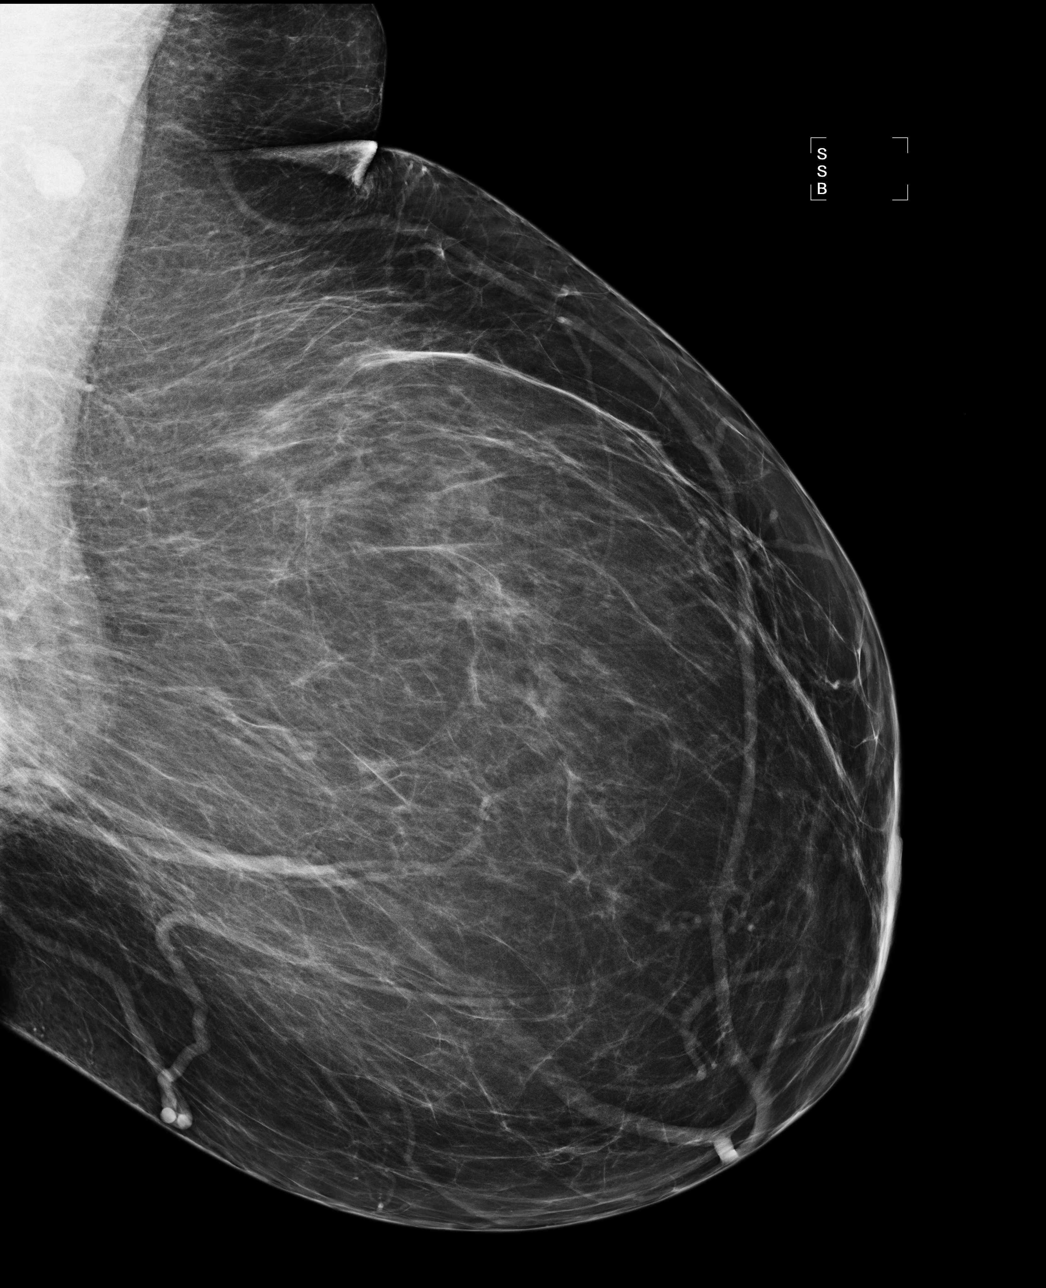

[R MLO]
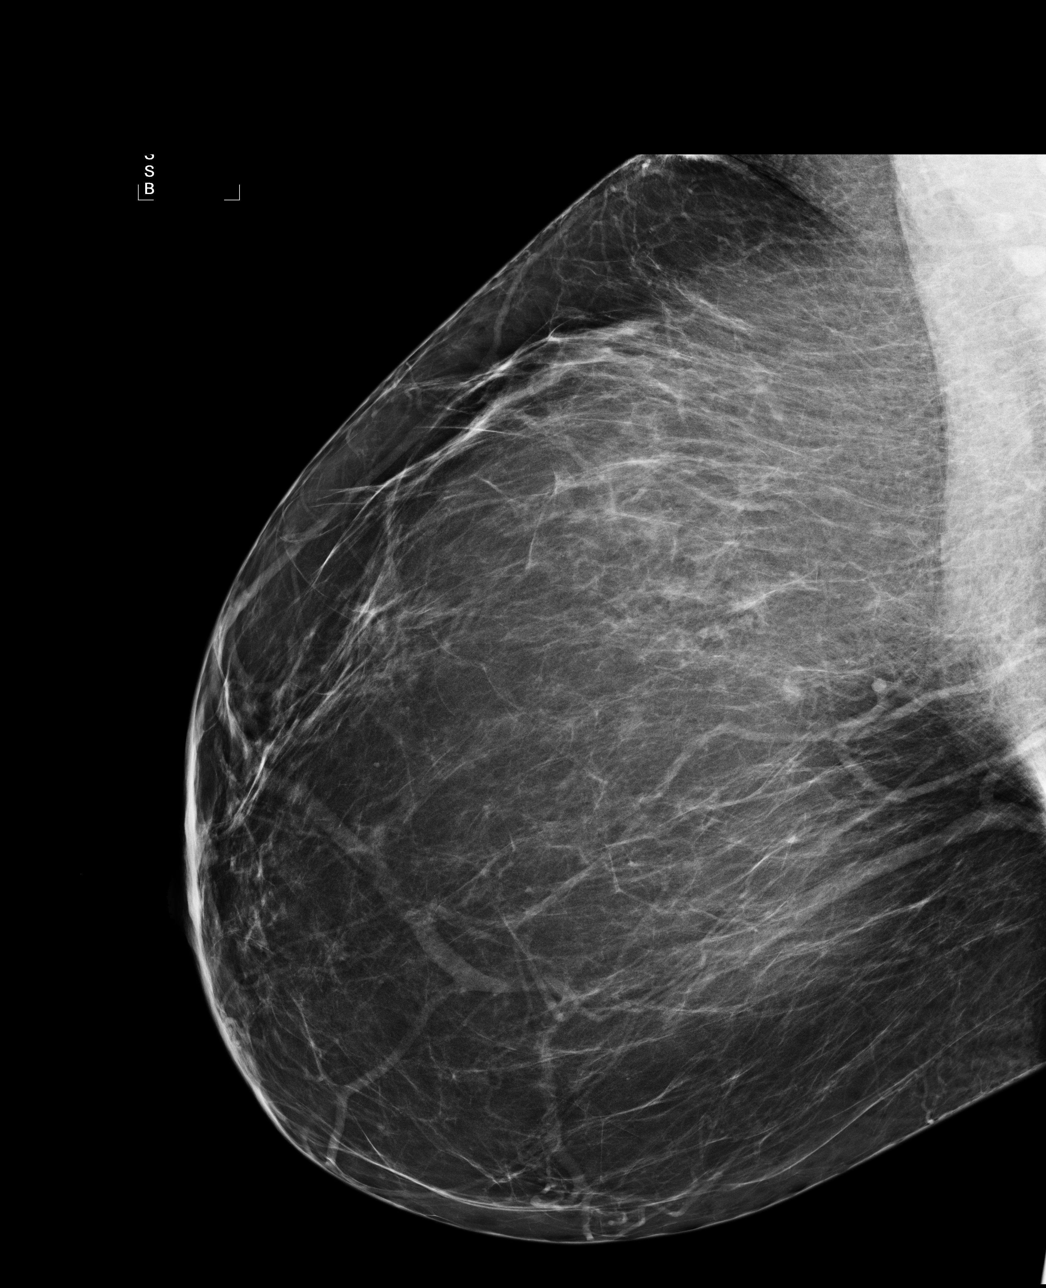

[4 of 4 positions shown; findings below may reference images not displayed]

The breast tissue is almost entirely fatty.  There is no dominant mass, architectural distortion or
calcification to suggest malignancy.

Images were processed with CAD.
IMPRESSION: No mammographic evidence of malignancy.  Suggest yearly screening mammography.

A result letter of this screening mammogram will be mailed directly to the patient.

ASSESSMENT: Negative - BI-RADS 1

Screening mammogram in 1 year.
,

## 2009-02-01 ENCOUNTER — Encounter: Payer: Self-pay | Admitting: Physician Assistant

## 2009-02-01 LAB — CONVERTED CEMR LAB: Rubella: 60 intl units/mL — ABNORMAL HIGH

## 2010-06-19 ENCOUNTER — Other Ambulatory Visit: Payer: Self-pay | Admitting: Occupational Medicine

## 2010-06-19 ENCOUNTER — Ambulatory Visit: Payer: Self-pay

## 2010-06-19 DIAGNOSIS — R52 Pain, unspecified: Secondary | ICD-10-CM

## 2010-06-19 IMAGING — CR DG HAND COMPLETE 3+V*R*
3 series · 3 of 3 positions shown · non-contrast
Comparison: None.

CLINICAL DATA: Blunt trauma [DATE].  Mild soft tissue swelling.

RIGHT HAND - COMPLETE 3+ VIEW

[view not recorded (1 of 3)]
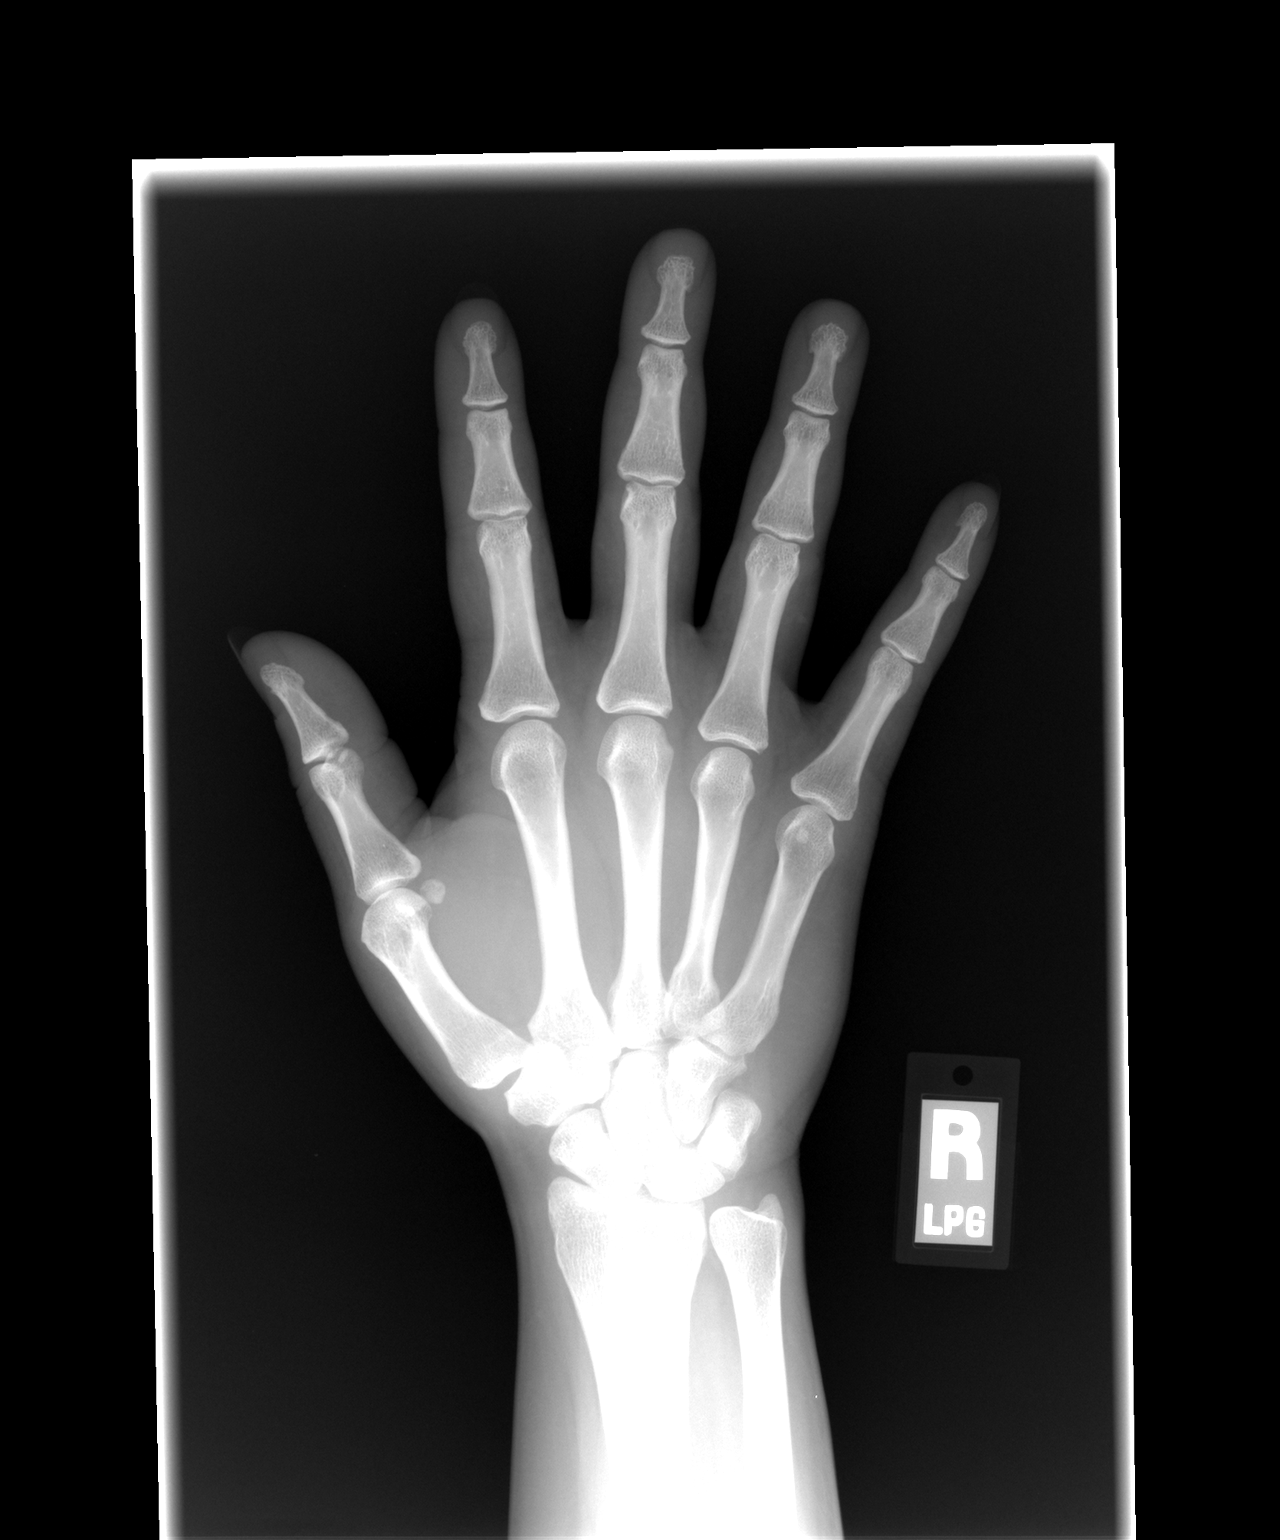

[view not recorded (2 of 3)]
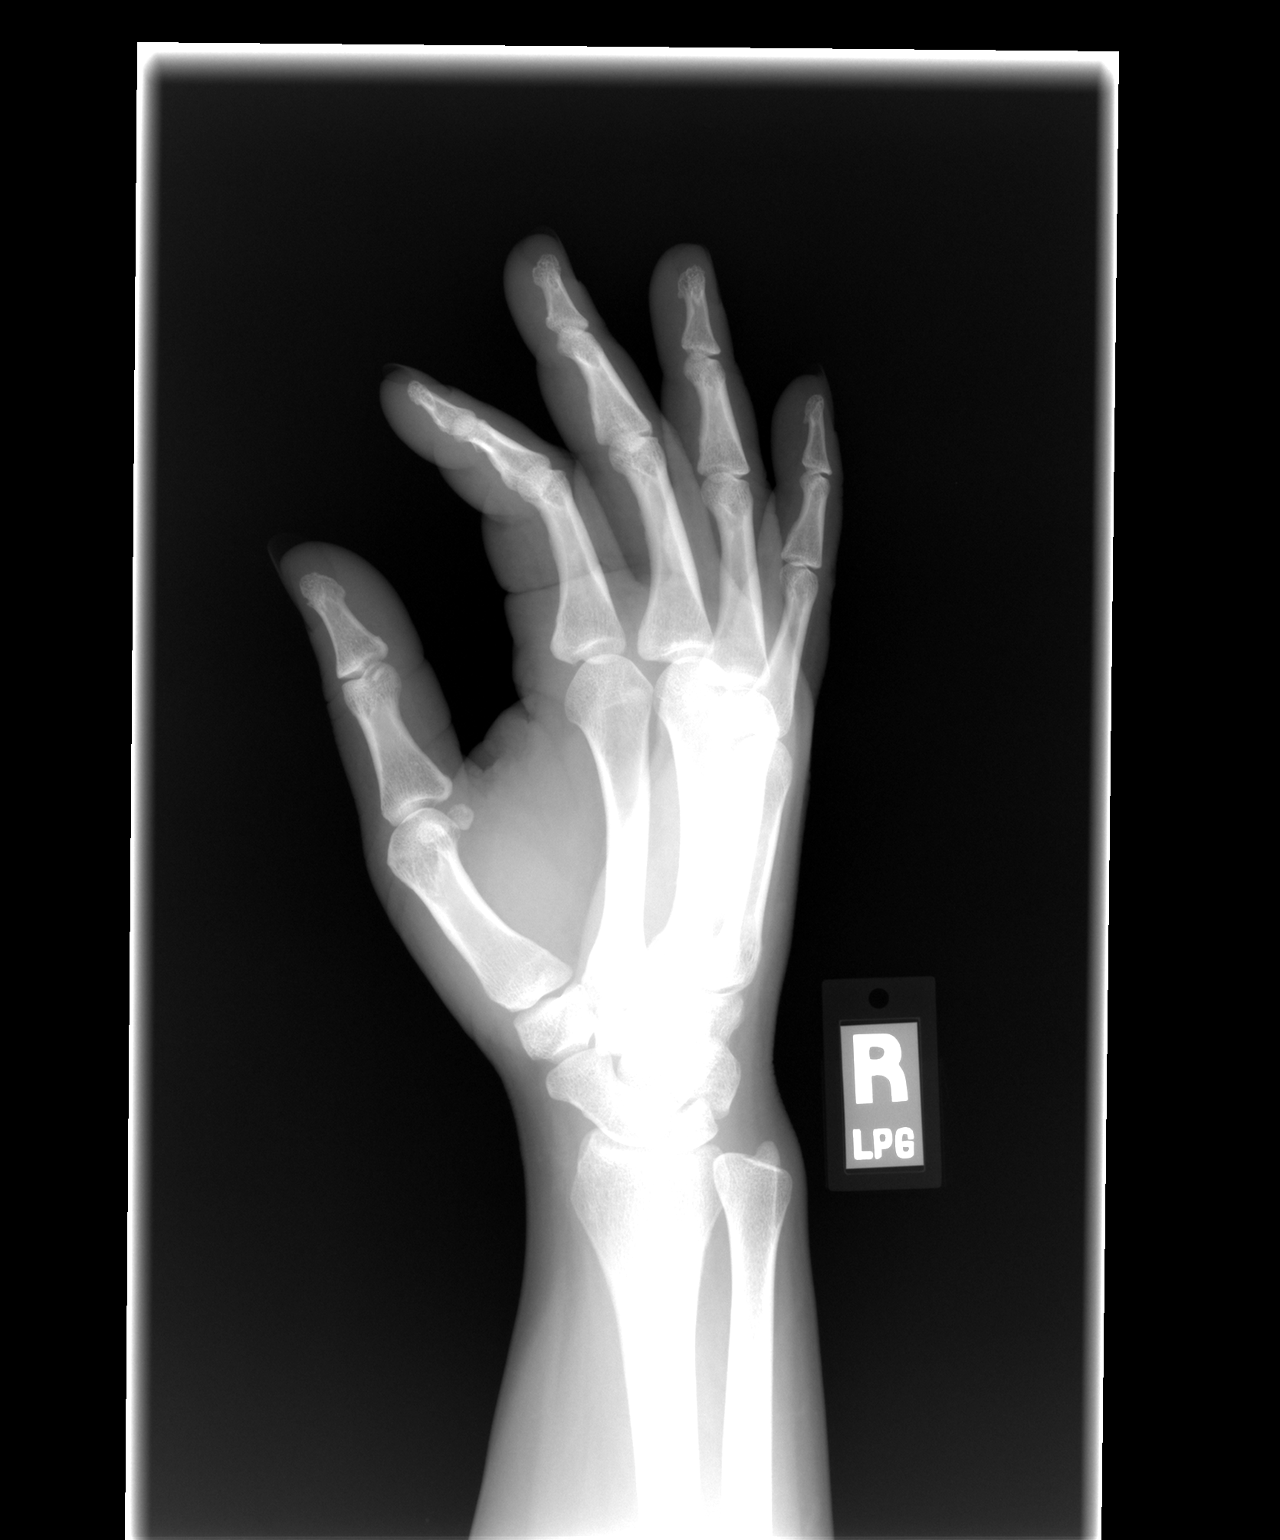

[view not recorded (3 of 3)]
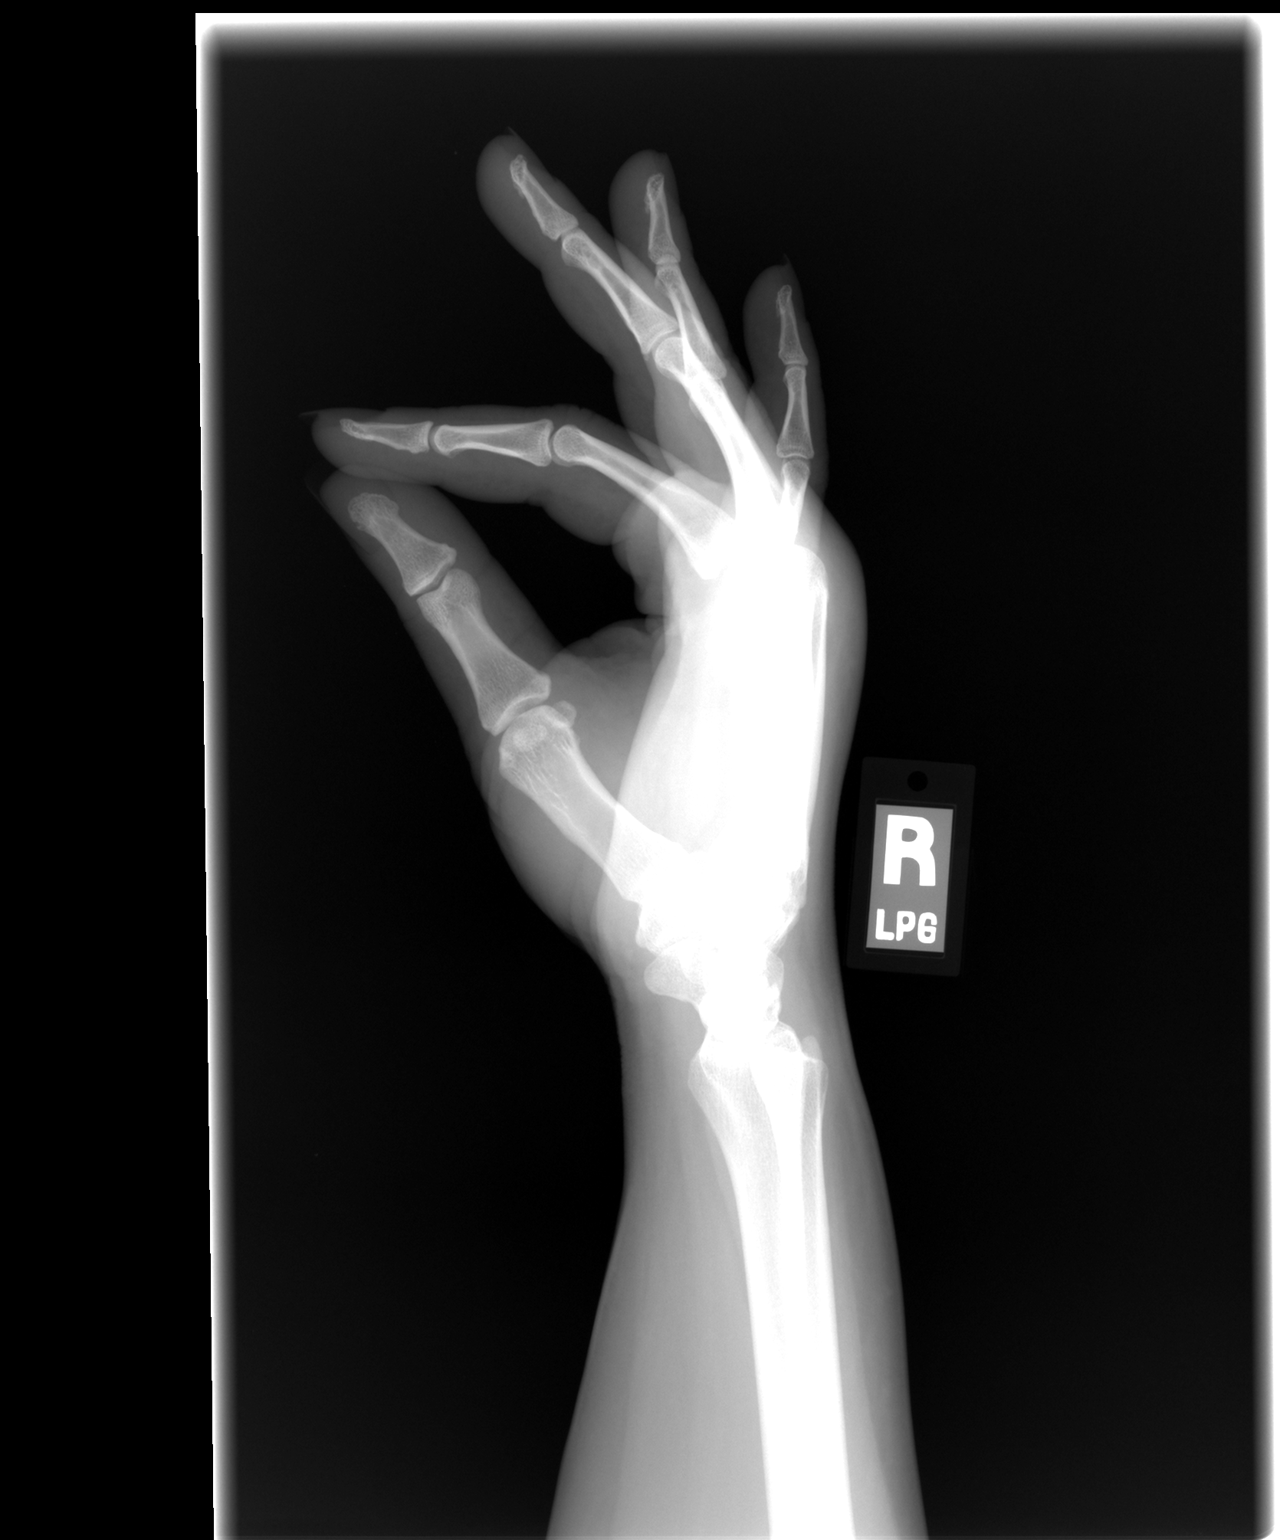

[3 of 3 positions shown; findings below may reference images not displayed]

FINDINGS: There is no evidence of fracture or dislocation.  There
is no evidence of arthropathy or other focal bone abnormality.
Soft tissues show no evidence for radiopaque foreign body.  There
is mild soft tissue swelling dorsally over the metacarpal heads.
IMPRESSION: Negative.

## 2010-07-15 NOTE — H&P (Signed)
Tara, House           ACCOUNT NO.:  [id number]   MEDICAL RECORD NO.:  [id number]          PATIENT TYPE:  INP   LOCATION:  3733                         FACILITY:  [facility]   PHYSICIAN:  Corinna L. Lendell Caprice, MDDATE OF BIRTH:  [birth year]   DATE OF ADMISSION:  12/17/2007  DATE OF DISCHARGE:                              HISTORY & PHYSICAL   CHIEF COMPLAINT:  Chest pain.   HISTORY OF PRESENT ILLNESS:  Tara House is a 45 year old black female  patient of Dr. Nicholos Johns who presents with several hours' worth of left-  sided chest pain.  She rates it at worst 9/10, currently 7/10.  It was  relieved somewhat by Toradol.  She also had some nausea with it.  It  also radiated to her back.  It did not change with position.  She has  had no shortness of breath.  No cough.  No wheeze.  She has a history of  hypertension and family history of early MI.  She has never had a  cardiac workup.  She has had no recent travel.  She has had no leg pain  or PE risk.   PAST MEDICAL HISTORY:  Hypertension.   MEDICATIONS:  1. Multivitamin a day.  2. Micardis 40 mg p.o. b.i.d.   ALLERGIES:  No known drug allergies.   SOCIAL HISTORY:  The patient drinks wine occasionally.  She does not  smoke or use drugs.  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   FAMILY HISTORY:  Her father had died of a heart attack in his late 11s.  Her mother had heart problems in her 26s, she also had a stroke.   PAST SURGICAL HISTORY:  Partial thyroidectomy, bilateral tubal ligation.   REVIEW OF SYSTEMS:  As above, otherwise negative.   PHYSICAL EXAMINATION:  VITAL SIGNS:  Temperature is 98.4, blood pressure  initially 185/110, subsequently 141/85, pulse 90, respiratory rate 18,  oxygen saturation 97%.  GENERAL:  The patient is well-nourished, well-developed, in no acute  distress.  HEENT:  Normocephalic, atraumatic.  Pupils equal, round, reactive to  light.  Sclerae nonicteric.  Moist mucous membranes.  NECK:  Neck is supple.   No lymphadenopathy.  LUNGS:  Lungs are clear to auscultation bilaterally without wheezes,  rhonchi or rales.  CARDIOVASCULAR:  Regular rate and rhythm without murmurs, gallops or  rubs.  She does have some left-sided chest wall tenderness which  reproduces the pain.  ABDOMEN:  Soft, nontender, nondistended.  GU:  Deferred.  RECTAL:  Deferred.  EXTREMITIES:  No clubbing, cyanosis or edema.  No calf tenderness.  Denna Haggard' sign negative.  NEUROLOGICAL:  The patient is alert and oriented.  Cranial nerves and  sensorimotor exam are intact.  PSYCHIATRIC:  Normal affect.  SKIN:  No rash.   LABS:  CBC is normal.  Basic metabolic panel normal.  CPK-MB is slightly  elevated at 8.1.  Troponin normal.  Myoglobin 97.  EKG shows normal  sinus rhythm.  One view of the chest shows nothing acute.   ASSESSMENT AND PLAN:  1. Chest pain:  Somewhat atypical for cardiac but she does have a  slightly elevated MB fraction, hypertension and a family history of      early heart disease.  She will be placed on telemetry.  She has      received aspirin and nitroglycerin.  I will continue the aspirin      and stop the nitroglycerin as she has had a headache and it has not      alleviated her pain.  She will get serial cardiac enzymes, continue      blood pressure medications and she may need further cardiac workup.      I suspect that this may be musculoskeletal, however, as she does      have reproducible pain.  I will therefore give Toradol as needed.  2. Hypertension.  Continue outpatient medications.      Corinna L. Lendell Caprice, MD  Electronically Signed     CLS/MEDQ  D:  12/17/2007  T:  12/18/2007  Job:  [id number]   cc:   Molly Maduro A. Nicholos Johns, M.D.

## 2010-07-15 NOTE — Assessment & Plan Note (Signed)
NAME:  Tara House, Tara House           ACCOUNT NO.:  [id number]   MEDICAL RECORD NO.:  [id number]          PATIENT TYPE:  POB   LOCATION:  [facility] at [place]         FACILITY:  [facility]   PHYSICIAN:  Maylon Cos, CNM    DATE OF BIRTH:  October 28, 1965   DATE OF SERVICE:  11/23/2008                                  CLINIC NOTE   The patient presents today for yearly annual physical and Pap smear.  She has no complaints.  She would like to discuss, however, the  possibility of removal today of skin tags or referral for removal and  also the possibility of breast reduction.   ALLERGIES:  The patient has no known drug allergies.   CURRENT MEDICATIONS:  Micardis 80 mg one p.o. daily which she has not  taken today.   IMMUNIZATIONS:  Up-to-date for rubella, tetanus and flu and she had  chicken pox as a child.   MENSTRUAL HISTORY:  Menarche began at age 59.  She has regular menstrual  cycles without bleeding in between them.  She has a light flow that last  for 3-4 days.  The day of her last period is November 02, 2008.   CONTRACEPTIVE HISTORY:  She had a tubal in 1998 at the [facility].   OBSTETRICAL HISTORY:  She has been pregnant four times.  She has three  living children and one miscarriage.  They were born vaginally without  complication.   GYNECOLOGIC HISTORY:  Her last Pap smear was in 2009.  She had a history  of an abnormal Pap smear in 2009 that was repeated without intervention.  Last mammogram was done in 2008 and it was normal.   SURGICAL HISTORY:  She had a tonsillectomy in 1985, hemithyroidectomy in  2007 and a tubal ligation in 1998.   FAMILY HISTORY:  She has no history of breast cancer, uterine cancer, or  cervical cancer in her personal or family history.   PERSONAL HISTORY:  She has thyroid disease and high blood pressure.   SOCIAL HISTORY:  She lives with her son and her daughter.  She is a  nonsmoker.  She is a social drinker that drinks one to two  alcoholic  beverages a week.  She denies history of drug abuse, sexual abuse or  physical abuse.   SYSTEMIC REVIEW:  Positive only for hot flashes, otherwise negative.   PHYSICAL EXAMINATION:  GENERAL:  Jailah is a pleasant 45 year old who  appears to be her stated age.  VITAL SIGNS:  Her pulse is 98, blood pressure is 176/105, recheck of her  blood pressure was 180/95, her weight today is 187, her height is 63  inches.  HEENT:  Grossly normal with good dentition.  LUNGS:  Clear to auscultation bilaterally A and P.  HEART:  Regular rate and rhythm without murmurs or bruits.  BREAST:  She has large breasts that are symmetrical without masses.  They are nontender.  Nipples are erect without discharge.  There is no  dimpling or retracting of the skin.  ABDOMEN:  Nontender, it is soft with no hepatosplenomegaly.  GENITALIA:  She is a Tanner V.  External genitalia is negative for  lesions.  Mucous membranes are pink  with irregular rugae with moderate  tone.  There are no lesions.  There is a scant amount of creamy non-  odorous discharge noted.  Her cervix is nonfriable.  No cervical motion  tenderness.  BIMANUAL:  Uterus is not enlarged and nontender.  Adnexa not enlarged  and nontender.  EXTREMITIES:  Warm to touch, no dependent edema, even hair distribution  and pedal pulses are equal.   ASSESSMENT/PLAN:  A 45 year old woman.  The patient is hypertensive.  The patient encouraged and planned for hypertension and the patient  should take medication as directed 80 mg of Micardis daily, should take  upon returning home today and follow up with her primary care Ayari Liwanag  for her routine blood pressure maintenance.   PLAN:  Health maintenance mammogram. A routine mammogram was scheduled.  Pap smear was obtained and sent.  The patient does desire screenings,  blood work for hepatitis, HIV and RPR and these will be obtained today.  The patient was referred to Dermatology for evaluation  of skin tag  removal.  The patient is encouraged to increase exercise and intake of  fresh fruits and vegetables and daily multivitamin.  The patient is to  return in 1 year for her annual physical and she will need a Pap smear  given her history of abnormal Pap smears.  She will need a repeat in 1  year.     ______________________________  Maylon Cos, CNM    ______________________________  Maylon Cos, CNM    SS/MEDQ  D:  03/04/2009  T:  03/05/2009  Job:  [id number]

## 2010-07-18 NOTE — Op Note (Signed)
[name], House           ACCOUNT NO.:  [id number]   MEDICAL RECORD NO.:  [id number]          PATIENT TYPE:  AMB   LOCATION:  DAY                          FACILITY:  [facility]   PHYSICIAN:  Velora Heckler, MD      DATE OF BIRTH:  03-Feb-1966   DATE OF PROCEDURE:  01/18/2006  DATE OF DISCHARGE:                               OPERATIVE REPORT   PREOPERATIVE DIAGNOSIS:  Right thyroid nodule with cytologic atypia.   POSTOPERATIVE DIAGNOSIS:  Right thyroid nodule with cytologic atypia.   PROCEDURE:  Right thyroid lobectomy.   SURGEON:  Velora Heckler, MD, FACS   ASSISTANT:  Lebron Conners, MD, FACS   ANESTHESIA:  General per Quentin Cornwall. Council Mechanic, M.D.   ESTIMATED BLOOD LOSS:  Minimal.   PREPARATION:  Betadine.   COMPLICATIONS:  None.   INDICATIONS:  The patient is a 45 year old black female from [place],  [place].  She is referred by Dr. Molli Knock with thyroid  nodules with cytologic atypia.  The patient now comes to surgery for  resection.   BODY OF REPORT:  Procedure is done in OR #6 at the [facility].  The patient is brought to the operating room, placed in  supine position on the operating room table.  Following administration  of general anesthesia the patient is positioned and then prepped and  draped in usual strict aseptic fashion.  After ascertaining that an  adequate level of anesthesia been obtained, a Kocher incision is made a  #15 blade.  Dissection was carried down through subcutaneous tissues.  Platysma was divided.  External jugular veins were suture ligated with 3-  0 Vicryl suture ligatures.  Skin flaps were developed cephalad and  caudad from the thyroid notch to the sternal notch.  A Mahorner self-  retaining retractor was placed for exposure.  Strap muscles were incised  in the midline, dissection was begun on the left side of the neck.  The  left thyroid lobe was exposed by reflecting strap muscles laterally.  Left lobe  was normal.  On palpation there are no nodules.  It was not  grossly enlarged.  On palpation there are no palpable nodules or areas  of lymphadenopathy.   Next we turned our attention to the right thyroid lobe.  Again strap  muscles are reflected laterally and the right lobe exposed.  Right lobe  was palpated.  There are small nodules present in the mid and upper  portions of the gland.  Right lobe was slightly enlarged.  Middle  thyroid vein is divided between medium Ligaclips.  Gland is rolled  anteriorly.  Superior pole is exposed.  Superior pole vessels were  dissected out.  There were ligated in continuity with 2-0 silk ties and  medium Ligaclips and divided.  Gland is rolled anteriorly.  Parathyroid  tissue was identified and preserved.  Branches of the inferior thyroid  artery are divided between small Ligaclips.  Inferior venous tributaries  were ligated in continuity with 2-0 silk ties and divided.  Gland is  rolled further anteriorly.  Recurrent nerve was identified and  preserved.  Ligament of Allyson Sabal is transected with electrocautery.  Gland  is mobilized off the trachea with electrocautery.  Gland is mobilized  across the isthmus to the left side of the trachea.  Gland is transected  between hemostats and suture ligated with 3-0 Vicryl suture ligatures.  Right thyroid lobe was submitted to pathology where Dr. Jimmy Picket  performed frozen section biopsy.  These sub centimeter nodules appear to  be benign on frozen section.  Good hemostasis was noted.  Neck is  irrigated with warm saline.  Surgicel was placed in the region of the  recurrent nerve and parathyroid glands.  Strap muscles were  reapproximated midline with interrupted 3-0 Vicryl sutures.  Platysma is  closed with interrupted 3-0 Vicryl sutures.  Skin was closed with  running 4-0 Vicryl subcuticular suture.  Wound is washed and dried and  Benzoin and Steri-Strips were applied.  Sterile dressings were applied.  The  patient is awakened from anesthesia and brought to the recovery room  in stable condition.  The patient tolerated the procedure well.      Velora Heckler, MD  Electronically Signed     TMG/MEDQ  D:  01/18/2006  T:  01/18/2006  Job:  [id number]   cc:   David Stall, M.D.  Fax: [phone]   Elana Alm. Nicholos Johns, M.D.  Fax: [phone]

## 2010-09-19 ENCOUNTER — Encounter: Payer: Self-pay | Admitting: *Deleted

## 2010-09-19 ENCOUNTER — Emergency Department (INDEPENDENT_AMBULATORY_CARE_PROVIDER_SITE_OTHER): Payer: 59

## 2010-09-19 ENCOUNTER — Emergency Department (HOSPITAL_BASED_OUTPATIENT_CLINIC_OR_DEPARTMENT_OTHER)
Admission: EM | Admit: 2010-09-19 | Discharge: 2010-09-19 | Disposition: A | Discharge status: Home / Self Care | Payer: 59 | Attending: Emergency Medicine | Admitting: Emergency Medicine

## 2010-09-19 DIAGNOSIS — N83209 Unspecified ovarian cyst, unspecified side: Secondary | ICD-10-CM

## 2010-09-19 DIAGNOSIS — I10 Essential (primary) hypertension: Secondary | ICD-10-CM | POA: Insufficient documentation

## 2010-09-19 DIAGNOSIS — N760 Acute vaginitis: Secondary | ICD-10-CM | POA: Insufficient documentation

## 2010-09-19 DIAGNOSIS — R110 Nausea: Secondary | ICD-10-CM

## 2010-09-19 DIAGNOSIS — A499 Bacterial infection, unspecified: Secondary | ICD-10-CM | POA: Insufficient documentation

## 2010-09-19 DIAGNOSIS — R1031 Right lower quadrant pain: Secondary | ICD-10-CM

## 2010-09-19 DIAGNOSIS — B9689 Other specified bacterial agents as the cause of diseases classified elsewhere: Secondary | ICD-10-CM | POA: Insufficient documentation

## 2010-09-19 HISTORY — DX: Essential (primary) hypertension: I10

## 2010-09-19 LAB — COMPREHENSIVE METABOLIC PANEL
ALT: 25 U/L (ref 0–35)
AST: 36 U/L (ref 0–37)
Albumin: 3.6 g/dL (ref 3.5–5.2)
Alkaline Phosphatase: 72 U/L (ref 39–117)
Calcium: 9 mg/dL (ref 8.4–10.5)
Chloride: 100 mEq/L (ref 96–112)
Creatinine, Ser: 0.7 mg/dL (ref 0.50–1.10)
Sodium: 137 mEq/L (ref 135–145)
Total Bilirubin: 0.3 mg/dL (ref 0.3–1.2)
Total Protein: 7.6 g/dL (ref 6.0–8.3)

## 2010-09-19 LAB — URINALYSIS, ROUTINE W REFLEX MICROSCOPIC
Hgb urine dipstick: NEGATIVE
Ketones, ur: NEGATIVE mg/dL
Leukocytes, UA: NEGATIVE
Protein, ur: NEGATIVE mg/dL
Specific Gravity, Urine: 1.016 (ref 1.005–1.030)
Urobilinogen, UA: 0.2 mg/dL (ref 0.0–1.0)
pH: 6 (ref 5.0–8.0)

## 2010-09-19 LAB — DIFFERENTIAL
Basophils Relative: 0 % (ref 0–1)
Eosinophils Absolute: 0 10*3/uL (ref 0.0–0.7)
Neutro Abs: 7 10*3/uL (ref 1.7–7.7)
Neutrophils Relative %: 73 % (ref 43–77)

## 2010-09-19 LAB — PREGNANCY, URINE: Preg Test, Ur: NEGATIVE

## 2010-09-19 LAB — CBC
MCH: 29.4 pg (ref 26.0–34.0)
MCHC: 33.6 g/dL (ref 30.0–36.0)
Platelets: 281 10*3/uL (ref 150–400)

## 2010-09-19 LAB — WET PREP, GENITAL: Yeast Wet Prep HPF POC: NONE SEEN

## 2010-09-19 IMAGING — CT CT ABD-PELV W/ CM
2 of 5 series · 17 of 46 positions shown, 19 images · IV contrast (APPLIED)
Comparison: None.

CLINICAL DATA: Right lower quadrant abdominal pain with nausea and
chills.

CT ABDOMEN AND PELVIS WITH CONTRAST
TECHNIQUE: Multidetector CT imaging of the abdomen and pelvis was
performed following the standard protocol during bolus
administration of intravenous contrast.
Contrast: 100 ml [S7]

[Series 2: abd/pelvis 5.0 b31f · axial · 0.70mm/px · z∈[-646,-232]mm · 14 of 93 slices shown, 16 images]
[im 5/93  soft-tissue]
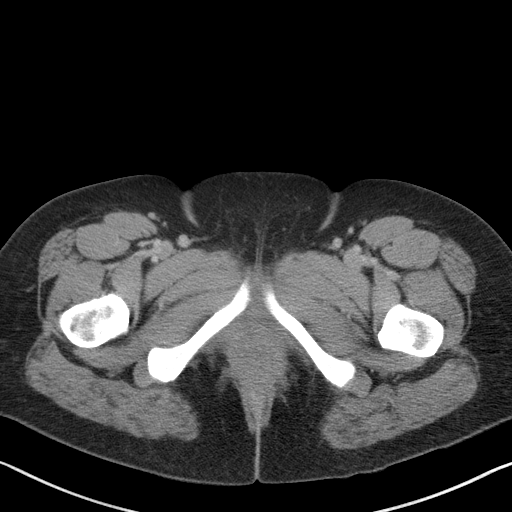
[im 5/93  bone]
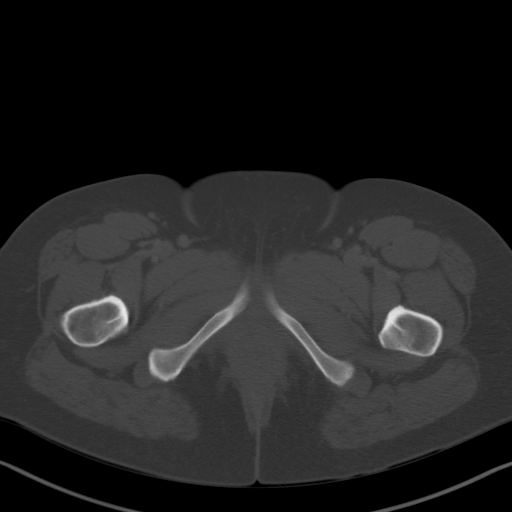
[im 14/93  soft-tissue]
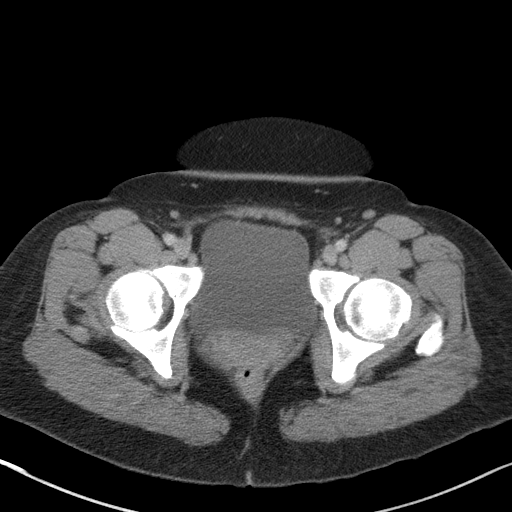
[im 19/93  soft-tissue]
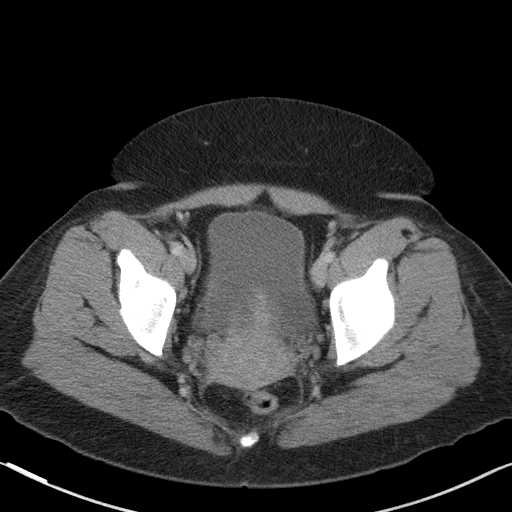
[im 24/93  soft-tissue]
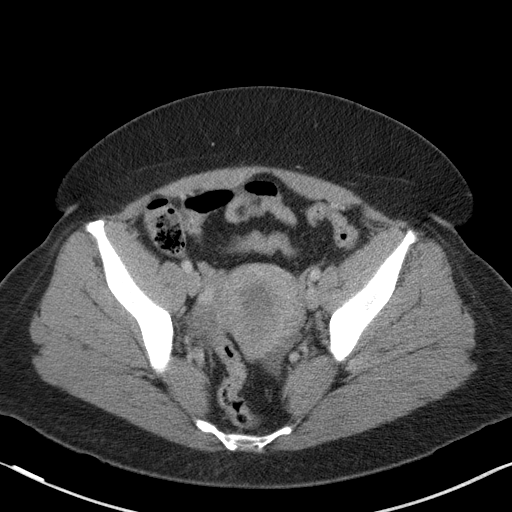
[im 33/93  soft-tissue]
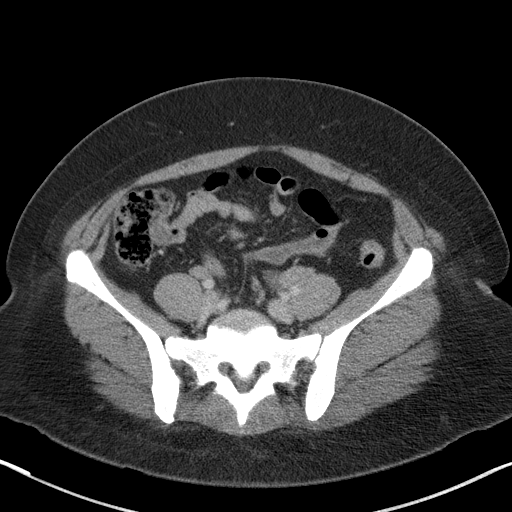
[im 37/93  soft-tissue]
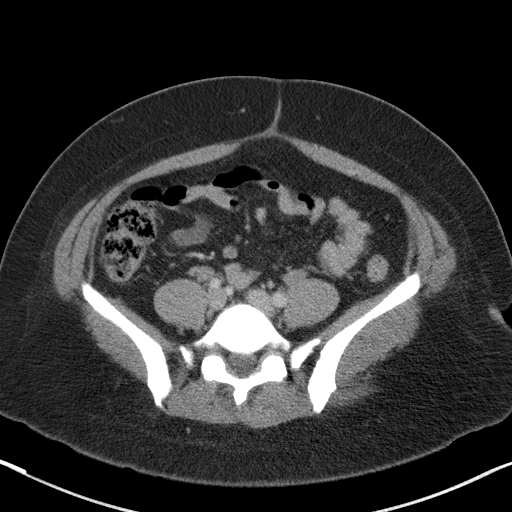
[im 42/93  soft-tissue]
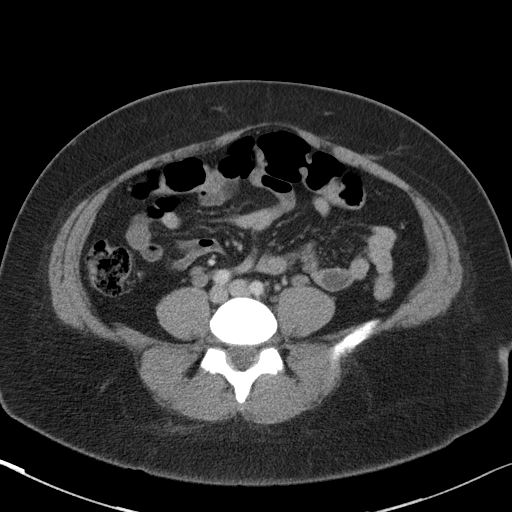
[im 51/93  soft-tissue]
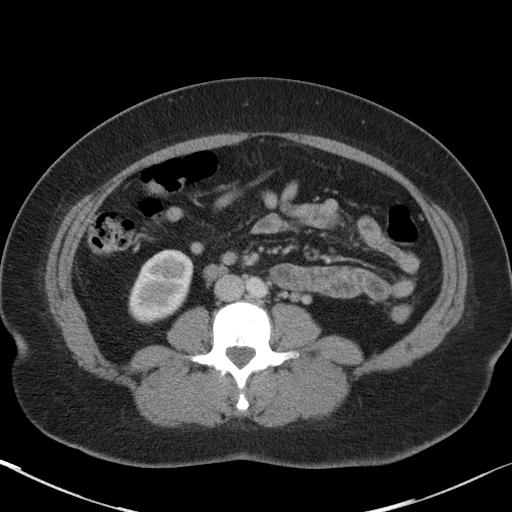
[im 56/93  soft-tissue]
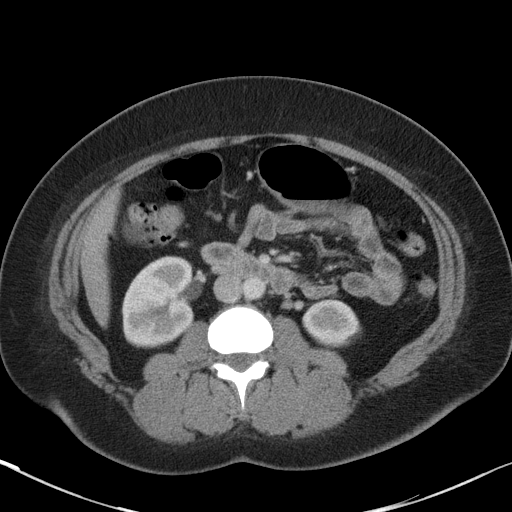
[im 56/93  bone]
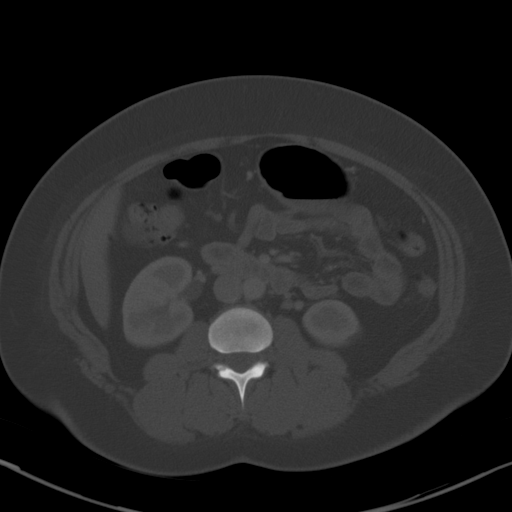
[im 60/93  soft-tissue]
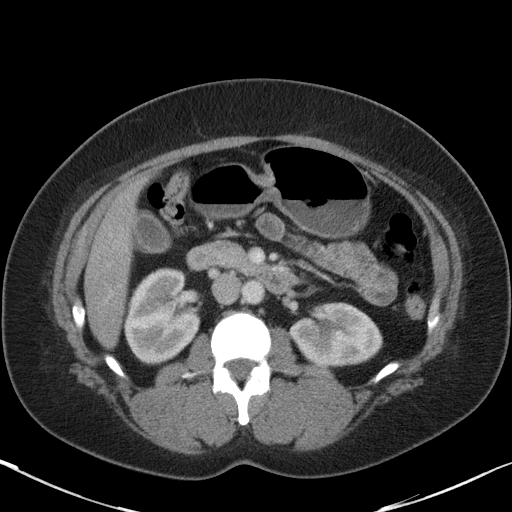
[im 70/93  soft-tissue]
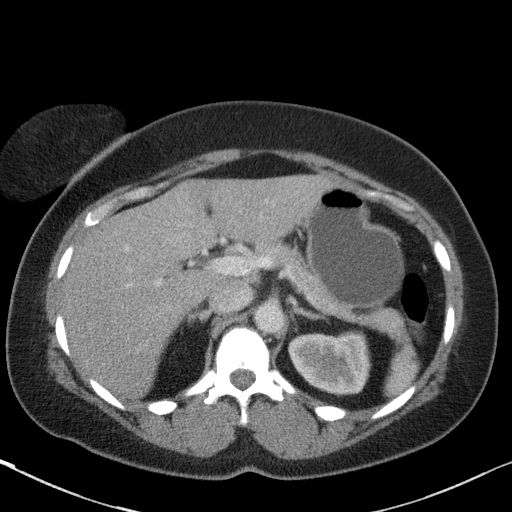
[im 74/93  soft-tissue]
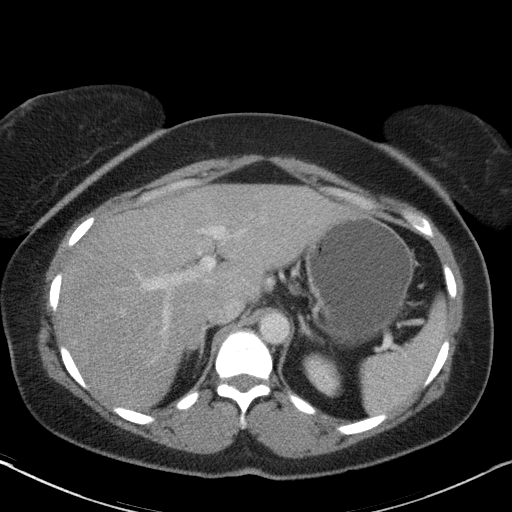
[im 79/93  soft-tissue]
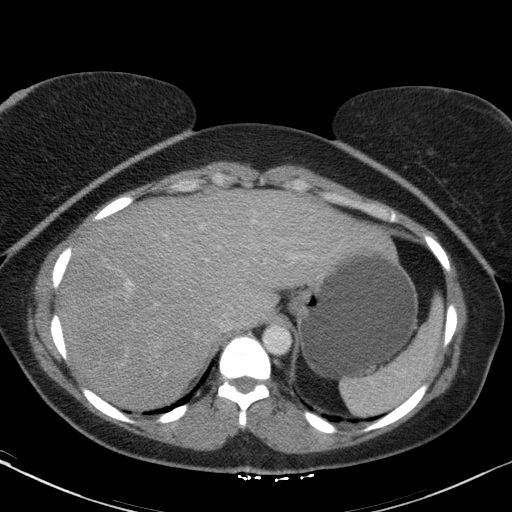
[im 88/93  soft-tissue]
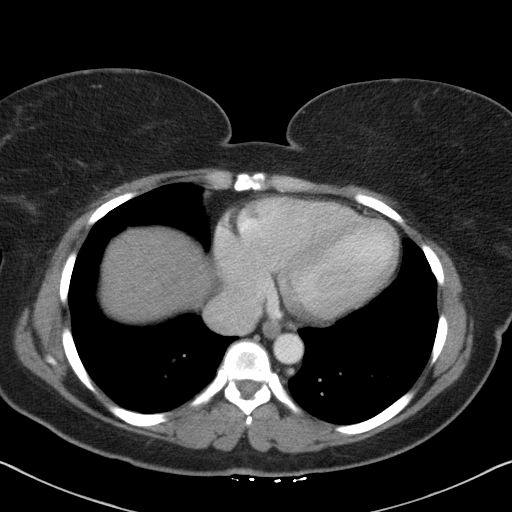

[Series 5: abd/pelvis 3.0 coronal · coronal · 0.89mm/px · 3 of 72 slices shown]
[im 24/72  soft-tissue]
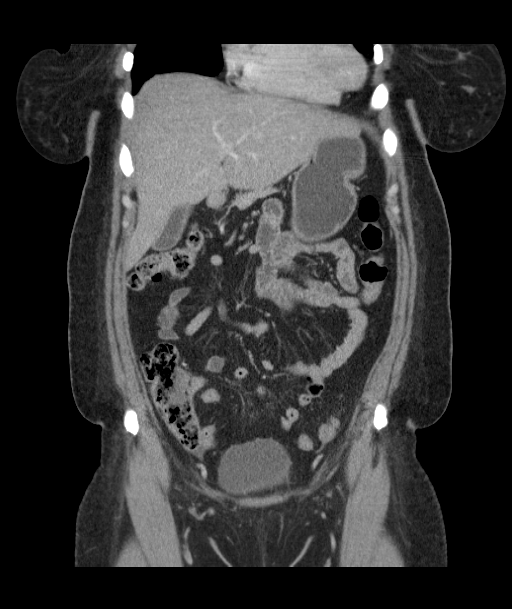
[im 32/72  soft-tissue]
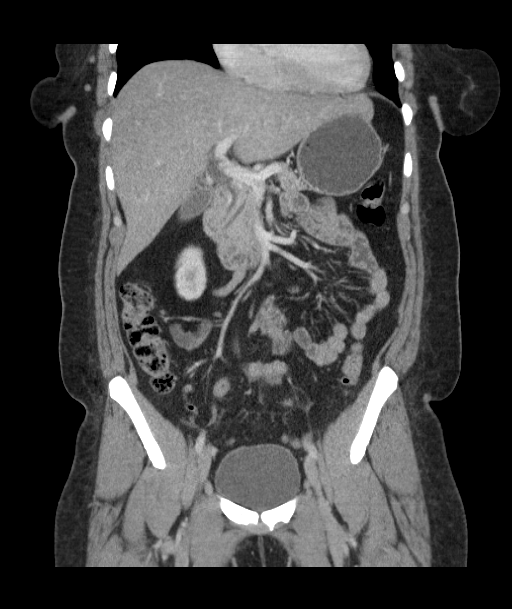
[im 40/72  soft-tissue]
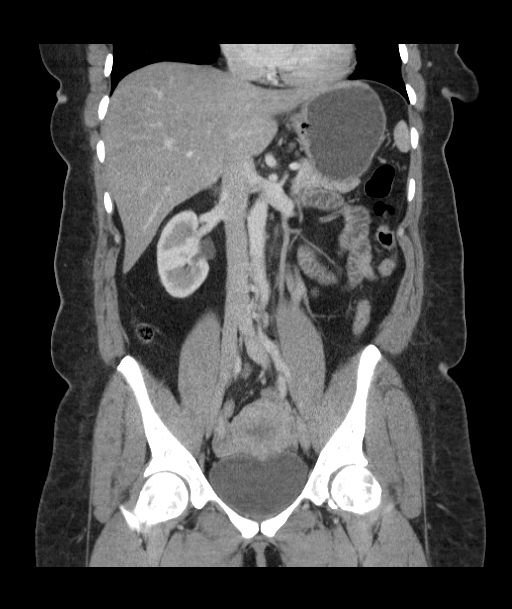

[17 of 46 positions shown; findings below may reference images not displayed]

FINDINGS: There is slight edema of the gallbladder wall distally.
Gallbladder is not distended and there are no visible stones.  No
biliary ductal dilatation.

There is a partially collapsed 16 mm cyst in the right ovary with a
small amount of adjacent free fluid in the pelvis.  Uterus and left
ovary are normal.  Terminal ileum and appendix are normal.  No
diverticular disease.  No renal or ureteral calculi.

Liver, spleen, pancreas, left adrenal gland, and kidneys are
normal.  16 mm lesion on the right adrenal gland is nonspecific.
This probably represents an adrenal adenoma.

No significant osseous abnormality.  Slight degenerative spurring
on the left femoral head.
IMPRESSION: 1.  Partially ruptured right ovarian cyst with adjacent free fluid.
2.  Slight edema in the gallbladder wall, nonspecific.
3.  16 mm adrenal lesion.  Statistically it probably represents an
adrenal adenoma.  The washout of contrast on delayed imaging was
50%, consistent with an adenoma.

## 2010-09-19 MED ORDER — ONDANSETRON HCL 4 MG/2ML IJ SOLN
4.0000 mg | Freq: Once | INTRAMUSCULAR | Status: AC
  Administered 2010-09-19: 4 mg via INTRAVENOUS
  Filled 2010-09-19: qty 2

## 2010-09-19 MED ORDER — MORPHINE SULFATE 2 MG/ML IJ SOLN
2.0000 mg | Freq: Once | INTRAMUSCULAR | Status: AC
  Administered 2010-09-19: 2 mg via INTRAVENOUS
  Filled 2010-09-19: qty 1

## 2010-09-19 MED ORDER — ONDANSETRON HCL 4 MG PO TABS: 4.0000 mg | ORAL_TABLET | Freq: Four times a day (QID) | ORAL | Status: AC

## 2010-09-19 MED ORDER — IOHEXOL 300 MG/ML  SOLN
100.0000 mL | Freq: Once | INTRAMUSCULAR | Status: AC | PRN
  Administered 2010-09-19: 100 mL via INTRAVENOUS

## 2010-09-19 MED ORDER — HYDROCODONE-ACETAMINOPHEN 5-500 MG PO TABS: 1.0000 | ORAL_TABLET | Freq: Four times a day (QID) | ORAL | Status: AC | PRN

## 2010-09-19 MED ORDER — METRONIDAZOLE 500 MG PO TABS: 500.0000 mg | ORAL_TABLET | Freq: Two times a day (BID) | ORAL | Status: AC

## 2010-09-19 MED ORDER — SODIUM CHLORIDE 0.9 % IV BOLUS (SEPSIS)
1000.0000 mL | Freq: Once | INTRAVENOUS | Status: AC
  Administered 2010-09-19: 1000 mL via INTRAVENOUS

## 2010-09-19 NOTE — ED Notes (Signed)
Pt reports sudden onset of right lower quadrant pain at 0430 am. Pt denies urinary symptoms. Pt tender to touch. Has been nauseated, but has not vomited.

## 2010-09-19 NOTE — ED Notes (Signed)
Pt. Has a driver.

## 2010-09-19 NOTE — ED Provider Notes (Signed)
History     Chief Complaint  Patient presents with  . Abdominal Pain   HPI History of Present Illness   Patient Identification Tara House is a 45 y.o. female.  Patient information was obtained from patient. History/Exam limitations: none. Patient presented to the Emergency Department by private vehicle.  Chief Complaint  Abdominal Pain   Patient presents for evaluation of abdominal pain. Onset of symptoms was abrupt starting 5 hours ago with gradually improving course since that time. The pain is located RLQ without radiation. The pain is rated as moderate and localized. The pain is made worse by palpation and is relieved by nothing. The patient also complains of diarrhea and nausea. The patient denies vomiting. The past workup has included nothing, has not had similar pain before. The pertinent past history includes hypertension. The patient denies PUD, GERD, gallstones, Crohn's disease, ulcerative colitis, irritable bowel. Home care consisted of warm bath with minimal relief.  No fever.  No dysuria.  No vaginal bleeding  Past Medical History  Diagnosis Date  . Hypertension    No family history on file.[phone]) Current Facility-Administered Medications  Medication Dose Route Frequency Provider Last Rate Last Dose  . iohexol (OMNIPAQUE) 300 MG/ML injection 100 mL  100 mL Intravenous Once PRN Medication Radiologist   100 mL at 09/19/10 1334  . morphine injection 2 mg  2 mg Intravenous Once Ethelda Chick, MD   2 mg at 09/19/10 1203  . ondansetron (ZOFRAN) injection 4 mg  4 mg Intravenous Once Ethelda Chick, MD   4 mg at 09/19/10 1203  . sodium chloride 0.9 % bolus 1,000 mL  1,000 mL Intravenous Once Ethelda Chick, MD   1,000 mL at 09/19/10 1202   Current Outpatient Prescriptions  Medication Sig Dispense Refill  . amLODipine (NORVASC) 5 MG tablet Take 5 mg by mouth daily.        . hydrochlorothiazide (,MICROZIDE/HYDRODIURIL,) 12.5 MG capsule Take 12.5 mg by mouth  daily.        Marland Kitchen losartan (COZAAR) 100 MG tablet Take 100 mg by mouth daily.         No Known Allergies History   Social History  . Marital Status: Married    Spouse Name: N/A    Number of Children: N/A  . Years of Education: N/A   Occupational History  . Not on file.   Social History Main Topics  . Smoking status: Never Smoker   . Smokeless tobacco: Not on file  . Alcohol Use: No  . Drug Use: No  . Sexually Active: Yes    Birth Control/ Protection: Other-see comments   Other Topics Concern  . Not on file   Social History Narrative  . No narrative on file   Review of Systems Pertinent items are noted in HPI. 10 Systems reviewed and are negative for acute change except as noted in the HPI.  Physical Exam   BP 144/77  Pulse 71  Temp(Src) 98.6 F (37 C) (Oral)  Resp 18  Ht 5\' 3"  (1.6 m)  Wt 182 lb (82.555 kg)  BMI 32.24 kg/m2  SpO2 100%  LMP 08/25/2010 BP 144/77  Pulse 71  Temp(Src) 98.6 F (37 C) (Oral)  Resp 18  Ht 5\' 3"  (1.6 m)  Wt 182 lb (82.555 kg)  BMI 32.24 kg/m2  SpO2 100%  LMP 08/25/2010 General appearance: alert, cooperative, no distress and moderately obese Head: Normocephalic, without obvious abnormality, atraumatic Throat: lips, mucosa, and tongue normal; teeth and  gums normal, mucous membranes moist Lungs: clear to auscultation bilaterally Heart: regular rate and rhythm, S1, S2 normal, no murmur, click, rub or gallop Abdomen: soft, nondistended, normal active bowel sounds, tenderness to palpation in RLQ, no gaurding, no rebound Pelvic: cervix normal in appearance, external genitalia normal, no adnexal masses or tenderness, no cervical motion tenderness, rectovaginal septum normal, uterus normal size, shape, and consistency and vagina normal without discharge- not tender in right adnexa- more tender above the adnexal region Extremities: extremities normal, atraumatic, no cyanosis or edema Pulses: 2+ and symmetric Skin: Skin color, texture,  turgor normal. No rashes or lesions Lymph nodes: Cervical, supraclavicular, and axillary nodes normal. Neurologic: Grossly normal  ED Course   Studies:  Results for orders placed during the hospital encounter of 09/19/10  URINALYSIS, ROUTINE W REFLEX MICROSCOPIC      Component Value Range   Color, Urine YELLOW  YELLOW    Appearance CLOUDY (*) CLEAR    Specific Gravity, Urine 1.016  1.005 - 1.030    pH 6.0  5.0 - 8.0    Glucose, UA NEGATIVE  NEGATIVE (mg/dL)   Hgb urine dipstick NEGATIVE  NEGATIVE    Bilirubin Urine NEGATIVE  NEGATIVE    Ketones, ur NEGATIVE  NEGATIVE (mg/dL)   Protein, ur NEGATIVE  NEGATIVE (mg/dL)   Urobilinogen, UA 0.2  0.0 - 1.0 (mg/dL)   Nitrite NEGATIVE  NEGATIVE    Leukocytes, UA NEGATIVE  NEGATIVE   PREGNANCY, URINE      Component Value Range   Preg Test, Ur NEGATIVE    CBC      Component Value Range   WBC 9.5  4.0 - 10.5 (K/uL)   RBC 3.78 (*) 3.87 - 5.11 (MIL/uL)   Hemoglobin 11.1 (*) 12.0 - 15.0 (g/dL)   HCT 95.6 (*) 21.3 - 46.0 (%)   MCV 87.3  78.0 - 100.0 (fL)   MCH 29.4  26.0 - 34.0 (pg)   MCHC 33.6  30.0 - 36.0 (g/dL)   RDW 08.6  57.8 - 46.9 (%)   Platelets 281  150 - 400 (K/uL)  DIFFERENTIAL      Component Value Range   Neutrophils Relative 73  43 - 77 (%)   Neutro Abs 7.0  1.7 - 7.7 (K/uL)   Lymphocytes Relative 17  12 - 46 (%)   Lymphs Abs 1.6  0.7 - 4.0 (K/uL)   Monocytes Relative 10  3 - 12 (%)   Monocytes Absolute 0.9  0.1 - 1.0 (K/uL)   Eosinophils Relative 0  0 - 5 (%)   Eosinophils Absolute 0.0  0.0 - 0.7 (K/uL)   Basophils Relative 0  0 - 1 (%)   Basophils Absolute 0.0  0.0 - 0.1 (K/uL)  COMPREHENSIVE METABOLIC PANEL      Component Value Range   Sodium 137  135 - 145 (mEq/L)   Potassium 4.0  3.5 - 5.1 (mEq/L)   Chloride 100  96 - 112 (mEq/L)   CO2 26  19 - 32 (mEq/L)   Glucose, Bld 98  70 - 99 (mg/dL)   BUN 15  6 - 23 (mg/dL)   Creatinine, Ser 6.29  0.50 - 1.10 (mg/dL)   Calcium 9.0  8.4 - 52.8 (mg/dL)   Total Protein  7.6  6.0 - 8.3 (g/dL)   Albumin 3.6  3.5 - 5.2 (g/dL)   AST 36  0 - 37 (U/L)   ALT 25  0 - 35 (U/L)   Alkaline Phosphatase 72  39 -  117 (U/L)   Total Bilirubin 0.3  0.3 - 1.2 (mg/dL)   GFR calc non Af Amer >60  >60 (mL/min)   GFR calc Af Amer >60  >60 (mL/min)  LIPASE, BLOOD      Component Value Range   Lipase 36  11 - 59 (U/L)  WET PREP, GENITAL      Component Value Range   Yeast, Wet Prep NONE SEEN  NONE SEEN    Trich, Wet Prep NONE SEEN  NONE SEEN    Clue Cells, Wet Prep MANY (*) NONE SEEN    WBC, Wet Prep HPF POC MANY (*) NONE SEEN    Ct Abdomen Pelvis W Contrast  09/19/2010  *RADIOLOGY REPORT*  Clinical Data: Right lower quadrant abdominal pain with nausea and chills.  CT ABDOMEN AND PELVIS WITH CONTRAST  Technique:  Multidetector CT imaging of the abdomen and pelvis was performed following the standard protocol during bolus administration of intravenous contrast.  Contrast: 100 ml Omnipaque-300  Comparison: None.  Findings: There is slight edema of the gallbladder wall distally. Gallbladder is not distended and there are no visible stones.  No biliary ductal dilatation.  There is a partially collapsed 16 mm cyst in the right ovary with a small amount of adjacent free fluid in the pelvis.  Uterus and left ovary are normal.  Terminal ileum and appendix are normal.  No diverticular disease.  No renal or ureteral calculi.  Liver, spleen, pancreas, left adrenal gland, and kidneys are normal.  16 mm lesion on the right adrenal gland is nonspecific. 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  No significant osseous abnormality.  Slight degenerative spurring on the left femoral head.  IMPRESSION:  1.  Partially ruptured right ovarian cyst with adjacent free fluid. 2.  Slight edema in the gallbladder wall, nonspecific. 3.  16 mm adrenal lesion.  Statistically it probably represents an adrenal adenoma.  The washout of contrast on delayed imaging was 50%, consistent with an adenoma.  Original Report  Authenticated By: Gwynn Burly, M.D.   Labs and CT also reviewed by me  Records Reviewed: Nursing notes.  Treatments: None. Morphine IV, zofran IV Intravenous 0.9NS bolus of 1000 ml given.   Disposition: Home Narcotic pain medication and Analgesics, follow up with PMD.   Past Medical History  Diagnosis Date  . Hypertension     Past Surgical History  Procedure Date  . Tubal ligation   . Tonsillectomy   . Thyroid surgery     No family history on file.  History  Substance Use Topics  . Smoking status: Never Smoker   . Smokeless tobacco: Not on file  . Alcohol Use: No    OB History    Grav Para Term Preterm Abortions TAB SAB Ect Mult Living                  Review of Systems  Physical Exam  BP 144/77  Pulse 71  Temp(Src) 98.6 F (37 C) (Oral)  Resp 18  Ht 5\' 3"  (1.6 m)  Wt 182 lb (82.555 kg)  BMI 32.24 kg/m2  SpO2 100%  LMP 08/25/2010  Physical Exam  ED Course  Procedures  MDM Pt with ruptured right ovarian cyst, causing pain in lower right abdomen/pelvis, incidental finding of adrenal adenoma.  Also clue cells on wet prep- will give rx for flagyl.  Pt is without pain after morphine x 1 dose. Discharged with strict return precautions.  Pt agreeable with plan.

## 2010-09-19 NOTE — ED Notes (Signed)
MD at bedside. 

## 2010-12-01 LAB — CARDIAC PANEL(CRET KIN+CKTOT+MB+TROPI)
CK, MB: 0.7
CK, MB: 0.8
Relative Index: INVALID
Total CK: 64
Total CK: 79
Troponin I: <0.01

## 2010-12-01 LAB — HEPATIC FUNCTION PANEL: Bilirubin, Direct: <0.1

## 2010-12-01 LAB — LIPID PANEL
Cholesterol: 135
LDL Cholesterol: 93

## 2010-12-01 LAB — LIPASE, BLOOD: Lipase: 37

## 2010-12-01 LAB — AMYLASE: Amylase: 60

## 2010-12-02 LAB — CBC
HCT: 38.2
Hemoglobin: 12.7
Platelets: 268
WBC: 8.5

## 2010-12-02 LAB — DIFFERENTIAL
Eosinophils Relative: 0
Lymphocytes Relative: 19
Lymphs Abs: 1.6
Monocytes Absolute: 1.1 — ABNORMAL HIGH
Neutro Abs: 5.6

## 2010-12-02 LAB — BASIC METABOLIC PANEL
Calcium: 9.2
GFR calc non Af Amer: >60
Potassium: 4
Sodium: 141

## 2010-12-02 LAB — POCT CARDIAC MARKERS
Myoglobin, poc: 97.8
Troponin i, poc: <0.05

## 2011-01-28 ENCOUNTER — Encounter: Payer: Self-pay | Admitting: Emergency Medicine

## 2011-01-28 ENCOUNTER — Emergency Department (INDEPENDENT_AMBULATORY_CARE_PROVIDER_SITE_OTHER)
Admission: EM | Admit: 2011-01-28 | Discharge: 2011-01-28 | Disposition: A | Discharge status: Home / Self Care | Payer: 59 | Source: Home / Self Care

## 2011-01-28 DIAGNOSIS — J069 Acute upper respiratory infection, unspecified: Secondary | ICD-10-CM

## 2011-01-28 MED ORDER — AZITHROMYCIN 250 MG PO TABS: ORAL_TABLET | ORAL | Status: DC

## 2011-01-28 MED ORDER — AMOXICILLIN 875 MG PO TABS: 875.0000 mg | ORAL_TABLET | Freq: Two times a day (BID) | ORAL | Status: AC

## 2011-01-28 MED ORDER — BENZONATATE 200 MG PO CAPS: 200.0000 mg | ORAL_CAPSULE | Freq: Every day | ORAL | Status: AC

## 2011-01-28 NOTE — ED Notes (Signed)
Cough, runny nose, congestion x 5 days

## 2011-02-01 NOTE — ED Provider Notes (Signed)
History     CSN: [id number] Arrival date & time: 01/28/2011  7:23 PM   None     Chief Complaint  Patient presents with  . URI     HPI Comments: Patient complains of approximately 5 day history of gradually progressive URI symptoms beginning with progressive nasal congestion.  No sore throat.  A cough started about 3 days ago.  Complains of fatigue and initial myalgias.  Cough is now worse at night and generally non-productive during the day.  There has been no pleuritic pain, shortness of breath, or wheezes.   Patient is a 45 y.o. female presenting with URI. The history is provided by the patient.  URI The primary symptoms include ear pain and cough. Primary symptoms do not include fever, fatigue, headaches, sore throat, swollen glands, wheezing, abdominal pain, nausea, vomiting, myalgias or rash. The current episode started 3 to 5 days ago. This is a new problem.  Symptoms associated with the illness include congestion and rhinorrhea. The illness is not associated with chills, plugged ear sensation, facial pain or sinus pressure.    Past Medical History  Diagnosis Date  . Hypertension     Past Surgical History  Procedure Date  . Tubal ligation   . Tonsillectomy   . Thyroid surgery     History reviewed. No pertinent family history.  History  Substance Use Topics  . Smoking status: Never Smoker   . Smokeless tobacco: Not on file  . Alcohol Use: No    OB History    Grav Para Term Preterm Abortions TAB SAB Ect Mult Living                  Review of Systems  Constitutional: Negative for fever, chills and fatigue.  HENT: Positive for ear pain, congestion and rhinorrhea. Negative for sore throat and sinus pressure.   Eyes: Negative.   Respiratory: Positive for cough. Negative for wheezing.   Gastrointestinal: Negative for nausea, vomiting and abdominal pain.  Genitourinary: Negative.   Musculoskeletal: Negative.  Negative for myalgias.  Skin: Negative.  Negative for  rash.  Neurological: Negative for headaches.    Allergies  Review of patient's allergies indicates no known allergies.  Home Medications   Current Outpatient Rx  Name Route Sig Dispense Refill  . AMLODIPINE BESYLATE 5 MG PO TABS Oral Take 5 mg by mouth daily.      . AMOXICILLIN 875 MG PO TABS Oral Take 1 tablet (875 mg total) by mouth 2 (two) times daily. (Rx void after 02/05/11) 14 tablet 0  . BENZONATATE 200 MG PO CAPS Oral Take 1 capsule (200 mg total) by mouth at bedtime. Take as needed for cough 12 capsule 0  . HYDROCHLOROTHIAZIDE 12.5 MG PO CAPS Oral Take 12.5 mg by mouth daily.      Marland Kitchen LOSARTAN POTASSIUM 100 MG PO TABS Oral Take 100 mg by mouth daily.        BP 141/90  Pulse 82  Temp(Src) 98.8 F (37.1 C) (Oral)  Resp 16  Ht 5' 3.5" (1.613 m)  Wt 193 lb (87.544 kg)  BMI 33.65 kg/m2  SpO2 97%  LMP 01/26/2011  Physical Exam  Nursing note and vitals reviewed. Constitutional: She is oriented to person, place, and time. She appears well-developed and well-nourished. No distress.  HENT:  Head: Normocephalic.  Right Ear: External ear normal.  Left Ear: External ear normal.  Nose: Nose normal.  Mouth/Throat: Oropharynx is clear and moist.  Eyes: Conjunctivae and EOM are normal.  Pupils are equal, round, and reactive to light. Right eye exhibits no discharge. Left eye exhibits no discharge.  Neck: Neck supple.  Cardiovascular: Regular rhythm and normal heart sounds.   Pulmonary/Chest: Effort normal and breath sounds normal. No respiratory distress. She has no wheezes. She has no rales.  Abdominal: Soft. There is no tenderness.  Musculoskeletal: She exhibits no edema.  Lymphadenopathy:    She has no cervical adenopathy.  Neurological: She is alert and oriented to person, place, and time.  Skin: Skin is warm and dry. No rash noted.  Psychiatric: She has a normal mood and affect.    ED Course  Procedures  none      1. Acute upper respiratory infections of unspecified  site       MDM  There is no evidence of bacterial infection today.  Treat symptomatically for now.  Rx written for cough suppressant at bedtime. Take Mucinex  (guaifenesin) twice daily for congestion.  Increase fluid intake, rest. May use Afrin nasal spray (or generic oxymetazoline) twice daily for about 5 days.  Also recommend using saline nasal spray several times daily and/or saline nasal irrigation. Stop all antihistamines for now, and other non-prescription cough/cold preparations such as Coricidin. Begin Amoxicillin if not improving about 5 days or if persistent fever develops. Follow-up with family doctor if not improving 7 to 10 days.        Donna Christen, MD 02/01/11 2208

## 2014-07-26 ENCOUNTER — Other Ambulatory Visit: Payer: Self-pay | Admitting: Family Medicine

## 2014-07-26 ENCOUNTER — Other Ambulatory Visit (HOSPITAL_COMMUNITY)
Admission: RE | Admit: 2014-07-26 | Discharge: 2014-07-26 | Disposition: A | Discharge status: Home / Self Care | Payer: BLUE CROSS/BLUE SHIELD | Source: Ambulatory Visit | Attending: Family Medicine | Admitting: Family Medicine

## 2014-07-26 DIAGNOSIS — Z124 Encounter for screening for malignant neoplasm of cervix: Secondary | ICD-10-CM | POA: Insufficient documentation

## 2014-07-31 LAB — CYTOLOGY - PAP

## 2015-04-18 ENCOUNTER — Ambulatory Visit (INDEPENDENT_AMBULATORY_CARE_PROVIDER_SITE_OTHER): Payer: BLUE CROSS/BLUE SHIELD | Admitting: Sports Medicine

## 2015-04-18 ENCOUNTER — Encounter: Payer: Self-pay | Admitting: Sports Medicine

## 2015-04-18 VITALS — BP 158/91 | Ht 63.0 in | Wt 168.0 lb

## 2015-04-18 DIAGNOSIS — S93401A Sprain of unspecified ligament of right ankle, initial encounter: Secondary | ICD-10-CM | POA: Diagnosis not present

## 2015-04-18 NOTE — Assessment & Plan Note (Signed)
Pt p/w grade 1 ATFL sprain with previous ligamentous laxity of the ankle - Start in ASO for stability for next 2-3 weeks - Peroneal tendon strengthening and proprioception  - Ice/NSAIDs/Elevation - [place] ankle rules negative.  F/U PRN.

## 2015-04-18 NOTE — Progress Notes (Signed)
  Tara House - 50 y.o. female MRN [id number]  Date of birth: 22-Dec-1965 Tara House is a 50 y.o. female who presents today for R ankle pain.  R ankle pain, initial visit 04/18/15-patient is today for right ankle pain with inversion injury about 6 weeks ago. This was gradually improving up until 3 days ago when she had another inversion ankle injury. Pain is located in the ATFL region and she has had previous sprains before. No paresthesias going distally. She denies any tenderness palpation at the posterior or tip of the lateral/medial malleolus. No foot tenderness palpation either. She has not done much to date for this.  PMHx - Updated and reviewed.  Contributory factors include: Hypertension PSHx - Updated and reviewed.  Contributory factors include:  Negative FHx - Updated and reviewed.  Contributory factors include:  Negative Medications - updated and reviewed  Social - Non smoker, works as Engineer, civil (consulting) at [employer] Per HPI.  12 point negative other than per HPI.   Exam:  Filed Vitals:   04/18/15 1002  BP: 158/91   Gen: NAD, AAO 3 Cardiorespiratory - Normal respiratory effort/rate.  RRR Skin: No rashes or erythema Extremities: No edema, pulses +2 bilateral upper and lower extremity  B/L Ankle: No visible erythema Range of motion is full in all directions. Strength is 5/5 in all directions. Stable lateral and medial ligaments; squeeze test and kleiger test unremarkable; Talar dome nontender; No pain at base of 5th MT; No tenderness over cuboid; No tenderness over N spot or navicular prominence No tenderness on posterior aspects of lateral and medial malleolus No sign of peroneal tendon subluxations; Negative tarsal tunnel tinel's Able to walk 4 steps.  TTP R ATFL with small amount of soft tissue edema.

## 2015-07-02 DIAGNOSIS — N611 Abscess of the breast and nipple: Secondary | ICD-10-CM | POA: Diagnosis not present

## 2015-07-02 DIAGNOSIS — L0291 Cutaneous abscess, unspecified: Secondary | ICD-10-CM | POA: Diagnosis not present

## 2015-08-02 DIAGNOSIS — I10 Essential (primary) hypertension: Secondary | ICD-10-CM | POA: Diagnosis not present

## 2015-08-05 DIAGNOSIS — I10 Essential (primary) hypertension: Secondary | ICD-10-CM | POA: Diagnosis not present

## 2015-08-05 DIAGNOSIS — Z0000 Encounter for general adult medical examination without abnormal findings: Secondary | ICD-10-CM | POA: Diagnosis not present

## 2015-08-05 DIAGNOSIS — F900 Attention-deficit hyperactivity disorder, predominantly inattentive type: Secondary | ICD-10-CM | POA: Diagnosis not present

## 2015-08-05 DIAGNOSIS — Z1211 Encounter for screening for malignant neoplasm of colon: Secondary | ICD-10-CM | POA: Diagnosis not present

## 2015-09-10 DIAGNOSIS — K640 First degree hemorrhoids: Secondary | ICD-10-CM | POA: Diagnosis not present

## 2015-09-10 DIAGNOSIS — Z1211 Encounter for screening for malignant neoplasm of colon: Secondary | ICD-10-CM | POA: Diagnosis not present

## 2016-02-26 DIAGNOSIS — F900 Attention-deficit hyperactivity disorder, predominantly inattentive type: Secondary | ICD-10-CM | POA: Diagnosis not present

## 2016-02-26 DIAGNOSIS — I10 Essential (primary) hypertension: Secondary | ICD-10-CM | POA: Diagnosis not present

## 2016-02-26 DIAGNOSIS — E119 Type 2 diabetes mellitus without complications: Secondary | ICD-10-CM | POA: Diagnosis not present

## 2016-02-26 DIAGNOSIS — E785 Hyperlipidemia, unspecified: Secondary | ICD-10-CM | POA: Diagnosis not present

## 2016-03-03 DIAGNOSIS — B349 Viral infection, unspecified: Secondary | ICD-10-CM | POA: Diagnosis not present

## 2016-03-03 DIAGNOSIS — R05 Cough: Secondary | ICD-10-CM | POA: Diagnosis not present

## 2016-03-03 DIAGNOSIS — J0100 Acute maxillary sinusitis, unspecified: Secondary | ICD-10-CM | POA: Diagnosis not present

## 2016-08-07 DIAGNOSIS — E119 Type 2 diabetes mellitus without complications: Secondary | ICD-10-CM | POA: Diagnosis not present

## 2016-08-07 DIAGNOSIS — I10 Essential (primary) hypertension: Secondary | ICD-10-CM | POA: Diagnosis not present

## 2016-08-07 DIAGNOSIS — Z1159 Encounter for screening for other viral diseases: Secondary | ICD-10-CM | POA: Diagnosis not present

## 2016-08-07 DIAGNOSIS — E7800 Pure hypercholesterolemia, unspecified: Secondary | ICD-10-CM | POA: Diagnosis not present

## 2016-08-10 DIAGNOSIS — Z0000 Encounter for general adult medical examination without abnormal findings: Secondary | ICD-10-CM | POA: Diagnosis not present

## 2016-08-10 DIAGNOSIS — F900 Attention-deficit hyperactivity disorder, predominantly inattentive type: Secondary | ICD-10-CM | POA: Diagnosis not present

## 2016-08-10 DIAGNOSIS — E7800 Pure hypercholesterolemia, unspecified: Secondary | ICD-10-CM | POA: Diagnosis not present

## 2016-08-10 DIAGNOSIS — N390 Urinary tract infection, site not specified: Secondary | ICD-10-CM | POA: Diagnosis not present

## 2016-08-10 DIAGNOSIS — E119 Type 2 diabetes mellitus without complications: Secondary | ICD-10-CM | POA: Diagnosis not present

## 2016-08-10 DIAGNOSIS — I10 Essential (primary) hypertension: Secondary | ICD-10-CM | POA: Diagnosis not present

## 2016-08-12 ENCOUNTER — Other Ambulatory Visit: Payer: Self-pay | Admitting: Family Medicine

## 2016-08-12 DIAGNOSIS — T881XXA Other complications following immunization, not elsewhere classified, initial encounter: Secondary | ICD-10-CM | POA: Diagnosis not present

## 2016-08-12 DIAGNOSIS — Z1231 Encounter for screening mammogram for malignant neoplasm of breast: Secondary | ICD-10-CM

## 2016-08-12 DIAGNOSIS — L0390 Cellulitis, unspecified: Secondary | ICD-10-CM | POA: Diagnosis not present

## 2016-08-14 ENCOUNTER — Ambulatory Visit: Payer: Self-pay

## 2016-08-18 ENCOUNTER — Ambulatory Visit: Payer: BLUE CROSS/BLUE SHIELD

## 2016-11-23 DIAGNOSIS — I10 Essential (primary) hypertension: Secondary | ICD-10-CM | POA: Diagnosis not present

## 2016-11-23 DIAGNOSIS — D509 Iron deficiency anemia, unspecified: Secondary | ICD-10-CM | POA: Diagnosis not present

## 2016-11-23 DIAGNOSIS — R7309 Other abnormal glucose: Secondary | ICD-10-CM | POA: Diagnosis not present

## 2016-11-25 DIAGNOSIS — D509 Iron deficiency anemia, unspecified: Secondary | ICD-10-CM | POA: Diagnosis not present

## 2016-11-25 DIAGNOSIS — E7800 Pure hypercholesterolemia, unspecified: Secondary | ICD-10-CM | POA: Diagnosis not present

## 2016-11-25 DIAGNOSIS — Z23 Encounter for immunization: Secondary | ICD-10-CM | POA: Diagnosis not present

## 2016-11-25 DIAGNOSIS — E119 Type 2 diabetes mellitus without complications: Secondary | ICD-10-CM | POA: Diagnosis not present

## 2016-11-25 DIAGNOSIS — I10 Essential (primary) hypertension: Secondary | ICD-10-CM | POA: Diagnosis not present

## 2017-02-09 DIAGNOSIS — M533 Sacrococcygeal disorders, not elsewhere classified: Secondary | ICD-10-CM | POA: Diagnosis not present

## 2017-02-09 DIAGNOSIS — S322XXA Fracture of coccyx, initial encounter for closed fracture: Secondary | ICD-10-CM | POA: Diagnosis not present

## 2017-02-16 DIAGNOSIS — S300XXD Contusion of lower back and pelvis, subsequent encounter: Secondary | ICD-10-CM | POA: Diagnosis not present

## 2017-03-09 DIAGNOSIS — I10 Essential (primary) hypertension: Secondary | ICD-10-CM | POA: Diagnosis not present

## 2017-03-09 DIAGNOSIS — E119 Type 2 diabetes mellitus without complications: Secondary | ICD-10-CM | POA: Diagnosis not present

## 2017-03-09 DIAGNOSIS — E7800 Pure hypercholesterolemia, unspecified: Secondary | ICD-10-CM | POA: Diagnosis not present

## 2017-03-18 DIAGNOSIS — E7800 Pure hypercholesterolemia, unspecified: Secondary | ICD-10-CM | POA: Diagnosis not present

## 2017-03-18 DIAGNOSIS — I10 Essential (primary) hypertension: Secondary | ICD-10-CM | POA: Diagnosis not present

## 2017-03-18 DIAGNOSIS — E119 Type 2 diabetes mellitus without complications: Secondary | ICD-10-CM | POA: Diagnosis not present

## 2017-03-18 DIAGNOSIS — F900 Attention-deficit hyperactivity disorder, predominantly inattentive type: Secondary | ICD-10-CM | POA: Diagnosis not present

## 2017-04-14 ENCOUNTER — Ambulatory Visit (INDEPENDENT_AMBULATORY_CARE_PROVIDER_SITE_OTHER): Payer: BLUE CROSS/BLUE SHIELD

## 2017-04-14 DIAGNOSIS — Z1231 Encounter for screening mammogram for malignant neoplasm of breast: Secondary | ICD-10-CM

## 2017-05-03 DIAGNOSIS — Z800 Family history of malignant neoplasm of digestive organs: Secondary | ICD-10-CM | POA: Diagnosis not present

## 2017-05-03 DIAGNOSIS — N920 Excessive and frequent menstruation with regular cycle: Secondary | ICD-10-CM | POA: Diagnosis not present

## 2017-05-03 DIAGNOSIS — N946 Dysmenorrhea, unspecified: Secondary | ICD-10-CM | POA: Diagnosis not present

## 2017-05-03 DIAGNOSIS — Z803 Family history of malignant neoplasm of breast: Secondary | ICD-10-CM | POA: Diagnosis not present

## 2017-05-03 DIAGNOSIS — Z8042 Family history of malignant neoplasm of prostate: Secondary | ICD-10-CM | POA: Diagnosis not present

## 2017-05-10 DIAGNOSIS — N924 Excessive bleeding in the premenopausal period: Secondary | ICD-10-CM | POA: Diagnosis not present

## 2017-05-10 DIAGNOSIS — N946 Dysmenorrhea, unspecified: Secondary | ICD-10-CM | POA: Diagnosis not present

## 2017-05-10 DIAGNOSIS — N852 Hypertrophy of uterus: Secondary | ICD-10-CM | POA: Diagnosis not present

## 2017-09-17 DIAGNOSIS — I10 Essential (primary) hypertension: Secondary | ICD-10-CM | POA: Diagnosis not present

## 2017-09-17 DIAGNOSIS — E119 Type 2 diabetes mellitus without complications: Secondary | ICD-10-CM | POA: Diagnosis not present

## 2017-09-17 DIAGNOSIS — D509 Iron deficiency anemia, unspecified: Secondary | ICD-10-CM | POA: Diagnosis not present

## 2017-09-17 DIAGNOSIS — E785 Hyperlipidemia, unspecified: Secondary | ICD-10-CM | POA: Diagnosis not present

## 2017-10-25 DIAGNOSIS — F900 Attention-deficit hyperactivity disorder, predominantly inattentive type: Secondary | ICD-10-CM | POA: Diagnosis not present

## 2017-10-25 DIAGNOSIS — E119 Type 2 diabetes mellitus without complications: Secondary | ICD-10-CM | POA: Diagnosis not present

## 2017-10-25 DIAGNOSIS — I10 Essential (primary) hypertension: Secondary | ICD-10-CM | POA: Diagnosis not present

## 2017-10-25 DIAGNOSIS — E7800 Pure hypercholesterolemia, unspecified: Secondary | ICD-10-CM | POA: Diagnosis not present

## 2018-01-24 DIAGNOSIS — E119 Type 2 diabetes mellitus without complications: Secondary | ICD-10-CM | POA: Diagnosis not present

## 2018-05-02 DIAGNOSIS — I10 Essential (primary) hypertension: Secondary | ICD-10-CM | POA: Diagnosis not present

## 2018-05-02 DIAGNOSIS — E7800 Pure hypercholesterolemia, unspecified: Secondary | ICD-10-CM | POA: Diagnosis not present

## 2018-05-02 DIAGNOSIS — E119 Type 2 diabetes mellitus without complications: Secondary | ICD-10-CM | POA: Diagnosis not present

## 2018-05-05 DIAGNOSIS — F900 Attention-deficit hyperactivity disorder, predominantly inattentive type: Secondary | ICD-10-CM | POA: Diagnosis not present

## 2018-05-05 DIAGNOSIS — E119 Type 2 diabetes mellitus without complications: Secondary | ICD-10-CM | POA: Diagnosis not present

## 2018-05-05 DIAGNOSIS — B356 Tinea cruris: Secondary | ICD-10-CM | POA: Diagnosis not present

## 2018-05-05 DIAGNOSIS — E7800 Pure hypercholesterolemia, unspecified: Secondary | ICD-10-CM | POA: Diagnosis not present

## 2018-05-05 DIAGNOSIS — I10 Essential (primary) hypertension: Secondary | ICD-10-CM | POA: Diagnosis not present

## 2018-05-30 DIAGNOSIS — R740 Nonspecific elevation of levels of transaminase and lactic acid dehydrogenase [LDH]: Secondary | ICD-10-CM | POA: Diagnosis not present

## 2018-11-14 DIAGNOSIS — E119 Type 2 diabetes mellitus without complications: Secondary | ICD-10-CM | POA: Diagnosis not present

## 2018-11-14 DIAGNOSIS — E7800 Pure hypercholesterolemia, unspecified: Secondary | ICD-10-CM | POA: Diagnosis not present

## 2018-11-14 DIAGNOSIS — I10 Essential (primary) hypertension: Secondary | ICD-10-CM | POA: Diagnosis not present

## 2018-11-21 DIAGNOSIS — F900 Attention-deficit hyperactivity disorder, predominantly inattentive type: Secondary | ICD-10-CM | POA: Diagnosis not present

## 2018-11-21 DIAGNOSIS — E7800 Pure hypercholesterolemia, unspecified: Secondary | ICD-10-CM | POA: Diagnosis not present

## 2018-11-21 DIAGNOSIS — E1165 Type 2 diabetes mellitus with hyperglycemia: Secondary | ICD-10-CM | POA: Diagnosis not present

## 2018-11-21 DIAGNOSIS — I10 Essential (primary) hypertension: Secondary | ICD-10-CM | POA: Diagnosis not present

## 2019-05-26 DIAGNOSIS — E1169 Type 2 diabetes mellitus with other specified complication: Secondary | ICD-10-CM | POA: Diagnosis not present

## 2019-05-26 DIAGNOSIS — I10 Essential (primary) hypertension: Secondary | ICD-10-CM | POA: Diagnosis not present

## 2019-05-26 DIAGNOSIS — E7800 Pure hypercholesterolemia, unspecified: Secondary | ICD-10-CM | POA: Diagnosis not present

## 2019-06-15 DIAGNOSIS — Z03818 Encounter for observation for suspected exposure to other biological agents ruled out: Secondary | ICD-10-CM | POA: Diagnosis not present

## 2019-06-15 DIAGNOSIS — Z20822 Contact with and (suspected) exposure to covid-19: Secondary | ICD-10-CM | POA: Diagnosis not present

## 2019-06-29 ENCOUNTER — Other Ambulatory Visit (HOSPITAL_COMMUNITY)
Admission: RE | Admit: 2019-06-29 | Discharge: 2019-06-29 | Disposition: A | Discharge status: Home / Self Care | Payer: BLUE CROSS/BLUE SHIELD | Source: Ambulatory Visit | Attending: Family Medicine | Admitting: Family Medicine

## 2019-06-29 ENCOUNTER — Other Ambulatory Visit: Payer: Self-pay | Admitting: Family Medicine

## 2019-06-29 DIAGNOSIS — Z0000 Encounter for general adult medical examination without abnormal findings: Secondary | ICD-10-CM | POA: Diagnosis not present

## 2019-06-29 DIAGNOSIS — Z7984 Long term (current) use of oral hypoglycemic drugs: Secondary | ICD-10-CM | POA: Diagnosis not present

## 2019-06-29 DIAGNOSIS — Z124 Encounter for screening for malignant neoplasm of cervix: Secondary | ICD-10-CM | POA: Diagnosis not present

## 2019-06-29 DIAGNOSIS — F900 Attention-deficit hyperactivity disorder, predominantly inattentive type: Secondary | ICD-10-CM | POA: Diagnosis not present

## 2019-06-29 DIAGNOSIS — I10 Essential (primary) hypertension: Secondary | ICD-10-CM | POA: Diagnosis not present

## 2019-06-29 DIAGNOSIS — E7800 Pure hypercholesterolemia, unspecified: Secondary | ICD-10-CM | POA: Diagnosis not present

## 2019-06-29 DIAGNOSIS — N951 Menopausal and female climacteric states: Secondary | ICD-10-CM | POA: Diagnosis not present

## 2019-06-29 DIAGNOSIS — E1165 Type 2 diabetes mellitus with hyperglycemia: Secondary | ICD-10-CM | POA: Diagnosis not present

## 2019-07-05 LAB — CYTOLOGY - PAP
Adequacy: ABSENT
Comment: NEGATIVE
Diagnosis: UNDETERMINED — AB
High risk HPV: NEGATIVE

## 2019-10-24 ENCOUNTER — Ambulatory Visit: Payer: BC Managed Care – PPO | Attending: Internal Medicine

## 2019-10-24 DIAGNOSIS — Z23 Encounter for immunization: Secondary | ICD-10-CM

## 2019-10-24 NOTE — Progress Notes (Signed)
   Covid-19 Vaccination Clinic  Name:  Laiklyn Pilkenton    MRN: [id number] DOB: [birth year]  10/24/2019  Ms. Ozella Almond was observed post Covid-19 immunization for 15 minutes without incident. She was provided with Vaccine Information Sheet and instruction to access the V-Safe system.   Ms. Ozella Almond was instructed to call 911 with any severe reactions post vaccine: Marland Kitchen Difficulty breathing  . Swelling of face and throat  . A fast heartbeat  . A bad rash all over body  . Dizziness and weakness   Immunizations Administered    Name Date Dose VIS Date Route   Pfizer COVID-19 Vaccine 10/24/2019  3:34 PM 0.3 mL 04/26/2018 Intramuscular   Manufacturer: [employer], [employer]   Lot: [id number]   NDC: [address]-8

## 2019-11-14 ENCOUNTER — Ambulatory Visit: Payer: BC Managed Care – PPO | Attending: Internal Medicine

## 2019-11-14 DIAGNOSIS — Z23 Encounter for immunization: Secondary | ICD-10-CM

## 2019-11-14 NOTE — Progress Notes (Signed)
   Covid-19 Vaccination Clinic  Name:  [name]    MRN: [id number] DOB: [birth year]  11/14/2019  Tara House was observed post Covid-19 immunization for 15 minutes without incident. She was provided with Vaccine Information Sheet and instruction to access the V-Safe system.   Tara House was instructed to call 911 with any severe reactions post vaccine: Marland Kitchen Difficulty breathing  . Swelling of face and throat  . A fast heartbeat  . A bad rash all over body  . Dizziness and weakness   Immunizations Administered    Name Date Dose VIS Date Route   Pfizer COVID-19 Vaccine 11/14/2019  2:45 PM 0.3 mL 04/26/2018 Intramuscular   Manufacturer: [employer], [employer]   Lot: [id number]   NDC: [id number]

## 2019-11-20 DIAGNOSIS — L03112 Cellulitis of left axilla: Secondary | ICD-10-CM | POA: Diagnosis not present

## 2019-12-08 ENCOUNTER — Other Ambulatory Visit (HOSPITAL_COMMUNITY): Payer: Self-pay | Admitting: Family Medicine

## 2019-12-26 DIAGNOSIS — I10 Essential (primary) hypertension: Secondary | ICD-10-CM | POA: Diagnosis not present

## 2019-12-26 DIAGNOSIS — L0390 Cellulitis, unspecified: Secondary | ICD-10-CM | POA: Diagnosis not present

## 2019-12-26 DIAGNOSIS — E1165 Type 2 diabetes mellitus with hyperglycemia: Secondary | ICD-10-CM | POA: Diagnosis not present

## 2020-01-02 DIAGNOSIS — I10 Essential (primary) hypertension: Secondary | ICD-10-CM | POA: Diagnosis not present

## 2020-01-02 DIAGNOSIS — E7800 Pure hypercholesterolemia, unspecified: Secondary | ICD-10-CM | POA: Diagnosis not present

## 2020-01-02 DIAGNOSIS — E1165 Type 2 diabetes mellitus with hyperglycemia: Secondary | ICD-10-CM | POA: Diagnosis not present

## 2020-01-02 DIAGNOSIS — N951 Menopausal and female climacteric states: Secondary | ICD-10-CM | POA: Diagnosis not present

## 2020-01-02 DIAGNOSIS — F900 Attention-deficit hyperactivity disorder, predominantly inattentive type: Secondary | ICD-10-CM | POA: Diagnosis not present

## 2020-01-13 ENCOUNTER — Other Ambulatory Visit (HOSPITAL_COMMUNITY): Payer: Self-pay | Admitting: Family Medicine

## 2020-01-17 DIAGNOSIS — E860 Dehydration: Secondary | ICD-10-CM | POA: Diagnosis not present

## 2020-01-23 ENCOUNTER — Other Ambulatory Visit (HOSPITAL_COMMUNITY): Payer: Self-pay | Admitting: Family Medicine

## 2020-01-30 ENCOUNTER — Other Ambulatory Visit (HOSPITAL_COMMUNITY): Payer: Self-pay | Admitting: Family Medicine

## 2020-01-30 DIAGNOSIS — Z20828 Contact with and (suspected) exposure to other viral communicable diseases: Secondary | ICD-10-CM | POA: Diagnosis not present

## 2020-01-30 DIAGNOSIS — G43109 Migraine with aura, not intractable, without status migrainosus: Secondary | ICD-10-CM | POA: Diagnosis not present

## 2020-04-05 ENCOUNTER — Other Ambulatory Visit: Payer: BC Managed Care – PPO

## 2020-04-05 DIAGNOSIS — Z20822 Contact with and (suspected) exposure to covid-19: Secondary | ICD-10-CM | POA: Diagnosis not present

## 2020-04-06 LAB — NOVEL CORONAVIRUS, NAA: SARS-CoV-2, NAA: NOT DETECTED

## 2020-04-06 LAB — SARS-COV-2, NAA 2 DAY TAT

## 2020-05-19 ENCOUNTER — Other Ambulatory Visit (HOSPITAL_COMMUNITY): Payer: Self-pay | Admitting: Family Medicine

## 2020-06-13 ENCOUNTER — Other Ambulatory Visit (HOSPITAL_COMMUNITY): Payer: Self-pay

## 2020-06-13 MED ORDER — AMOXICILLIN 500 MG PO CAPS
500.0000 mg | ORAL_CAPSULE | Freq: Three times a day (TID) | ORAL | 0 refills | Status: DC
  Filled 2020-06-13: qty 21, 7d supply, fill #0

## 2020-06-14 ENCOUNTER — Other Ambulatory Visit (HOSPITAL_COMMUNITY): Payer: Self-pay

## 2020-06-14 DIAGNOSIS — R220 Localized swelling, mass and lump, head: Secondary | ICD-10-CM | POA: Diagnosis not present

## 2020-06-14 MED ORDER — PREDNISONE 20 MG PO TABS
40.0000 mg | ORAL_TABLET | Freq: Every day | ORAL | 0 refills | Status: DC
  Filled 2020-06-14: qty 10, 5d supply, fill #0

## 2020-06-21 ENCOUNTER — Other Ambulatory Visit (HOSPITAL_COMMUNITY): Payer: Self-pay

## 2020-06-21 MED FILL — Rosuvastatin Calcium Tab 10 MG: ORAL | 28 days supply | Qty: 4 | Fill #0 | Status: AC

## 2020-06-21 MED FILL — Losartan Potassium & Hydrochlorothiazide Tab 100-25 MG: ORAL | 30 days supply | Qty: 30 | Fill #0 | Status: AC

## 2020-06-21 MED FILL — Sitagliptin Phosphate-Metformin HCl Tab ER 24HR 50-500 MG: ORAL | 90 days supply | Qty: 180 | Fill #0 | Status: CN

## 2020-06-22 DIAGNOSIS — J0100 Acute maxillary sinusitis, unspecified: Secondary | ICD-10-CM | POA: Diagnosis not present

## 2020-06-24 ENCOUNTER — Other Ambulatory Visit (HOSPITAL_COMMUNITY): Payer: Self-pay

## 2020-06-24 ENCOUNTER — Other Ambulatory Visit (HOSPITAL_BASED_OUTPATIENT_CLINIC_OR_DEPARTMENT_OTHER): Payer: Self-pay

## 2020-06-24 MED ORDER — AMOXICILLIN-POT CLAVULANATE 875-125 MG PO TABS
ORAL_TABLET | ORAL | 0 refills | Status: DC
  Filled 2020-06-24: qty 20, 10d supply, fill #0

## 2020-06-24 MED FILL — Sitagliptin-Metformin HCl Tab ER 24HR 50-500 MG: ORAL | 30 days supply | Qty: 60 | Fill #0 | Status: CN

## 2020-07-02 ENCOUNTER — Other Ambulatory Visit (HOSPITAL_COMMUNITY): Payer: Self-pay

## 2020-07-08 ENCOUNTER — Other Ambulatory Visit (HOSPITAL_BASED_OUTPATIENT_CLINIC_OR_DEPARTMENT_OTHER): Payer: Self-pay

## 2020-07-08 MED ORDER — VYVANSE 60 MG PO CAPS
ORAL_CAPSULE | ORAL | 0 refills | Status: DC
  Filled 2020-07-08: qty 30, 30d supply, fill #0

## 2020-07-09 ENCOUNTER — Other Ambulatory Visit (HOSPITAL_COMMUNITY): Payer: Self-pay

## 2020-07-09 DIAGNOSIS — E1165 Type 2 diabetes mellitus with hyperglycemia: Secondary | ICD-10-CM | POA: Diagnosis not present

## 2020-07-09 DIAGNOSIS — E559 Vitamin D deficiency, unspecified: Secondary | ICD-10-CM | POA: Diagnosis not present

## 2020-07-09 DIAGNOSIS — D509 Iron deficiency anemia, unspecified: Secondary | ICD-10-CM | POA: Diagnosis not present

## 2020-07-09 MED FILL — Sitagliptin-Metformin HCl Tab ER 24HR 50-500 MG: ORAL | 30 days supply | Qty: 60 | Fill #0 | Status: AC

## 2020-07-10 ENCOUNTER — Other Ambulatory Visit (HOSPITAL_BASED_OUTPATIENT_CLINIC_OR_DEPARTMENT_OTHER): Payer: Self-pay

## 2020-07-10 DIAGNOSIS — N951 Menopausal and female climacteric states: Secondary | ICD-10-CM | POA: Diagnosis not present

## 2020-07-10 DIAGNOSIS — E7800 Pure hypercholesterolemia, unspecified: Secondary | ICD-10-CM | POA: Diagnosis not present

## 2020-07-10 DIAGNOSIS — E1165 Type 2 diabetes mellitus with hyperglycemia: Secondary | ICD-10-CM | POA: Diagnosis not present

## 2020-07-10 DIAGNOSIS — F900 Attention-deficit hyperactivity disorder, predominantly inattentive type: Secondary | ICD-10-CM | POA: Diagnosis not present

## 2020-07-10 DIAGNOSIS — I10 Essential (primary) hypertension: Secondary | ICD-10-CM | POA: Diagnosis not present

## 2020-07-10 MED ORDER — LOSARTAN POTASSIUM-HCTZ 100-25 MG PO TABS
ORAL_TABLET | ORAL | 1 refills | Status: DC
  Filled 2020-07-10 – 2020-09-20 (×2): qty 90, 90d supply, fill #0
  Filled 2020-12-23: qty 90, 90d supply, fill #1

## 2020-07-10 MED ORDER — VYVANSE 60 MG PO CAPS: ORAL_CAPSULE | ORAL | 0 refills | Status: DC

## 2020-07-10 MED ORDER — VYVANSE 60 MG PO CAPS
ORAL_CAPSULE | ORAL | 0 refills | Status: DC
  Filled 2020-08-16: qty 30, 30d supply, fill #0

## 2020-07-10 MED ORDER — JANUMET XR 50-500 MG PO TB24
ORAL_TABLET | ORAL | 1 refills | Status: DC
  Filled 2020-07-10: qty 180, 90d supply, fill #0
  Filled 2020-12-23 – 2021-02-14 (×2): qty 60, 30d supply, fill #0
  Filled 2021-03-24: qty 60, 30d supply, fill #1
  Filled 2021-05-14: qty 60, 30d supply, fill #2
  Filled 2021-06-25: qty 60, 30d supply, fill #3

## 2020-07-23 ENCOUNTER — Other Ambulatory Visit (HOSPITAL_BASED_OUTPATIENT_CLINIC_OR_DEPARTMENT_OTHER): Payer: Self-pay

## 2020-07-23 MED FILL — Losartan Potassium & Hydrochlorothiazide Tab 100-25 MG: ORAL | 60 days supply | Qty: 60 | Fill #1 | Status: AC

## 2020-07-24 ENCOUNTER — Other Ambulatory Visit (HOSPITAL_BASED_OUTPATIENT_CLINIC_OR_DEPARTMENT_OTHER): Payer: Self-pay

## 2020-07-24 MED ORDER — ROSUVASTATIN CALCIUM 10 MG PO TABS
ORAL_TABLET | ORAL | 2 refills | Status: DC
  Filled 2020-07-24: qty 13, 90d supply, fill #0
  Filled 2020-08-16: qty 12, 84d supply, fill #0
  Filled 2020-12-23: qty 12, 84d supply, fill #1
  Filled 2021-03-13 – 2021-03-24 (×2): qty 12, 84d supply, fill #2

## 2020-08-01 DIAGNOSIS — I10 Essential (primary) hypertension: Secondary | ICD-10-CM | POA: Diagnosis not present

## 2020-08-01 DIAGNOSIS — N289 Disorder of kidney and ureter, unspecified: Secondary | ICD-10-CM | POA: Diagnosis not present

## 2020-08-02 DIAGNOSIS — R519 Headache, unspecified: Secondary | ICD-10-CM | POA: Diagnosis not present

## 2020-08-02 DIAGNOSIS — R208 Other disturbances of skin sensation: Secondary | ICD-10-CM | POA: Diagnosis not present

## 2020-08-05 ENCOUNTER — Other Ambulatory Visit (HOSPITAL_BASED_OUTPATIENT_CLINIC_OR_DEPARTMENT_OTHER): Payer: Self-pay

## 2020-08-12 ENCOUNTER — Other Ambulatory Visit: Payer: Self-pay | Admitting: Family Medicine

## 2020-08-12 DIAGNOSIS — R519 Headache, unspecified: Secondary | ICD-10-CM

## 2020-08-16 ENCOUNTER — Encounter: Payer: Self-pay | Admitting: Neurology

## 2020-08-16 ENCOUNTER — Other Ambulatory Visit (HOSPITAL_BASED_OUTPATIENT_CLINIC_OR_DEPARTMENT_OTHER): Payer: Self-pay

## 2020-08-16 DIAGNOSIS — I10 Essential (primary) hypertension: Secondary | ICD-10-CM | POA: Diagnosis not present

## 2020-08-16 DIAGNOSIS — R519 Headache, unspecified: Secondary | ICD-10-CM | POA: Diagnosis not present

## 2020-08-16 MED ORDER — PROPRANOLOL HCL ER 120 MG PO CP24
ORAL_CAPSULE | ORAL | 0 refills | Status: DC
  Filled 2020-08-16: qty 90, 90d supply, fill #0

## 2020-08-16 MED ORDER — TOPIRAMATE 25 MG PO TABS
ORAL_TABLET | ORAL | 0 refills | Status: DC
  Filled 2020-08-16: qty 42, 30d supply, fill #0

## 2020-08-16 MED FILL — Sitagliptin-Metformin HCl Tab ER 24HR 50-500 MG: ORAL | 30 days supply | Qty: 60 | Fill #1 | Status: AC

## 2020-08-19 ENCOUNTER — Other Ambulatory Visit (HOSPITAL_BASED_OUTPATIENT_CLINIC_OR_DEPARTMENT_OTHER): Payer: Self-pay

## 2020-08-21 ENCOUNTER — Ambulatory Visit: Payer: BC Managed Care – PPO | Attending: Internal Medicine

## 2020-08-21 DIAGNOSIS — Z20822 Contact with and (suspected) exposure to covid-19: Secondary | ICD-10-CM

## 2020-08-22 ENCOUNTER — Other Ambulatory Visit (HOSPITAL_BASED_OUTPATIENT_CLINIC_OR_DEPARTMENT_OTHER): Payer: Self-pay | Admitting: Family Medicine

## 2020-08-22 DIAGNOSIS — Z1231 Encounter for screening mammogram for malignant neoplasm of breast: Secondary | ICD-10-CM

## 2020-08-22 LAB — SARS-COV-2, NAA 2 DAY TAT

## 2020-08-22 LAB — NOVEL CORONAVIRUS, NAA: SARS-CoV-2, NAA: NOT DETECTED

## 2020-09-08 ENCOUNTER — Ambulatory Visit
Admission: RE | Admit: 2020-09-08 | Discharge: 2020-09-08 | Disposition: A | Discharge status: Home / Self Care | Payer: 59 | Source: Ambulatory Visit | Attending: Family Medicine | Admitting: Family Medicine

## 2020-09-08 ENCOUNTER — Other Ambulatory Visit: Payer: Self-pay

## 2020-09-08 DIAGNOSIS — R519 Headache, unspecified: Secondary | ICD-10-CM | POA: Diagnosis not present

## 2020-09-08 IMAGING — MR MR HEAD WO/W CM
12 series · 48 of 48 positions shown · IV contrast (multihance)
Comparison: None.

CLINICAL DATA: New onset headache

EXAM:
MRI HEAD WITHOUT AND WITH CONTRAST
MRA HEAD WITHOUT CONTRAST
TECHNIQUE: Multiplanar, multi-echo pulse sequences of the brain and surrounding
structures were acquired without and with intravenous contrast.
Angiographic images of the Circle of Willis were acquired using MRA
technique without intravenous contrast.
CONTRAST:  16mL MULTIHANCE GADOBENATE DIMEGLUMINE 529 MG/ML IV SOLN

[Series 2: T1 · sagittal · 5.0mm · 0.45mm/px · 1 of 21 slices shown]
[im 1/21]
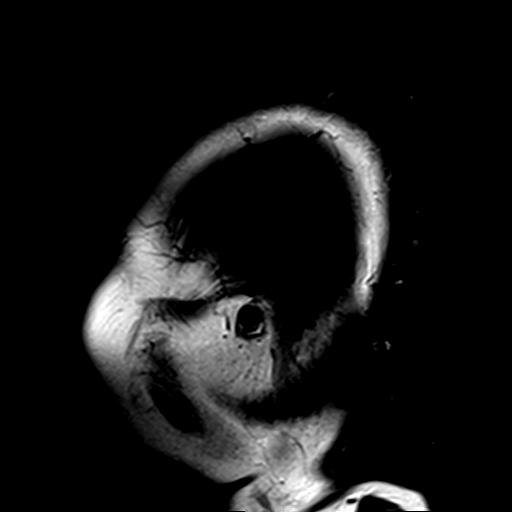

[Series 3: DWI · axial · 3.0mm · 1.80mm/px · z∈[-41,+105]mm · 7 of 98 slices shown (1 of 4)]
[im 1/98]
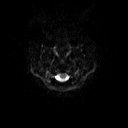
[im 17/98]
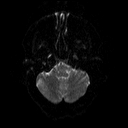
[im 33/98]
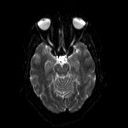
[im 49/98]
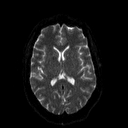
[im 65/98]
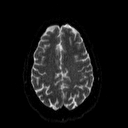
[im 81/98]
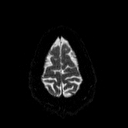
[im 98/98]
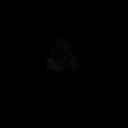

[Series 4: DWI · axial · 3.0mm · 1.80mm/px · z∈[-41,+105]mm · 3 of 50 slices shown (2 of 4)]
[im 1/50]
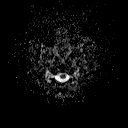
[im 25/50]
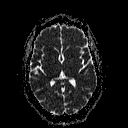
[im 50/50]
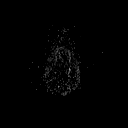

[Series 5: DWI · coronal · 5.0mm · 1.80mm/px · 4 of 65 slices shown (3 of 4)]
[im 1/65]
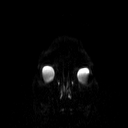
[im 22/65]
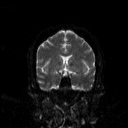
[im 43/65]
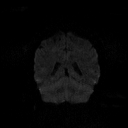
[im 65/65]
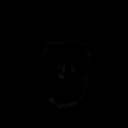

[Series 6: DWI · coronal · 5.0mm · 1.80mm/px · 2 of 34 slices shown (4 of 4)]
[im 1/34]
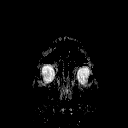
[im 34/34]
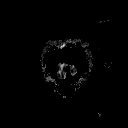

[Series 7: T2 · axial · 5.0mm · 0.60mm/px · z∈[-46,+101]mm · 2 of 23 slices shown (1 of 2)]
[im 1/23]
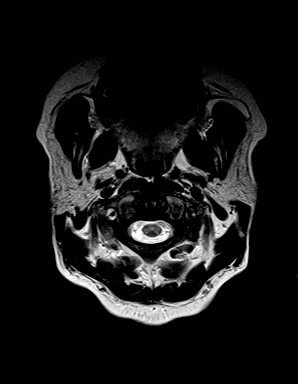
[im 23/23]
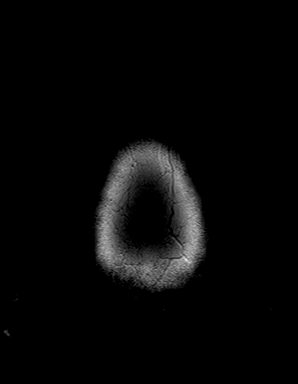

[Series 8: FLAIR · axial · 3.0mm · 0.45mm/px · z∈[-46,+102]mm · 2 of 33 slices shown]
[im 1/33]
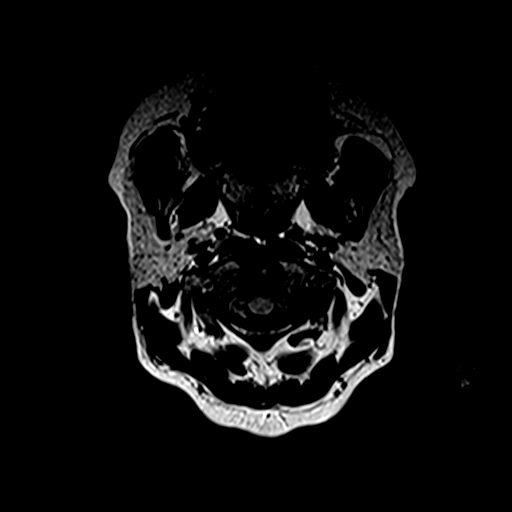
[im 33/33]
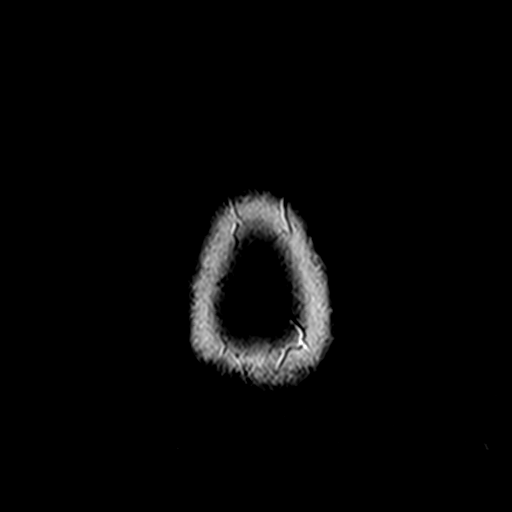

[Series 10: swi_images · axial · 4.0mm · 0.90mm/px · z∈[-49,+106]mm · 3 of 40 slices shown]
[im 1/40]
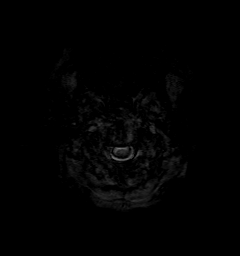
[im 20/40]
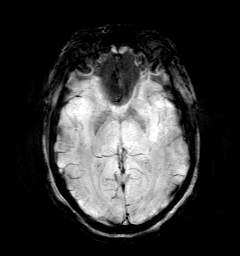
[im 40/40]
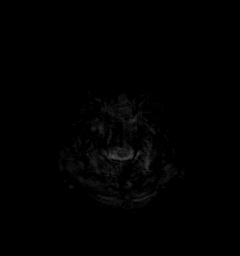

[Series 11: t1_mpr_tra · axial · 1.1mm · 0.75mm/px · z∈[-48,+102]mm · 10 of 144 slices shown]
[im 1/144]
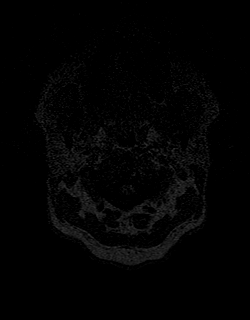
[im 16/144]
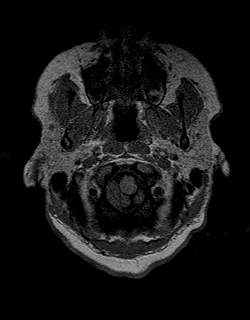
[im 32/144]
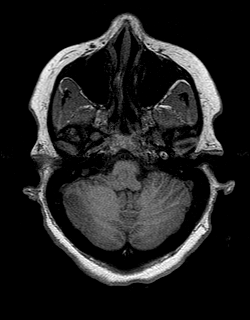
[im 48/144]
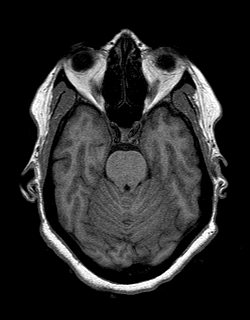
[im 64/144]
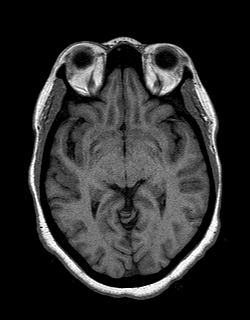
[im 80/144]
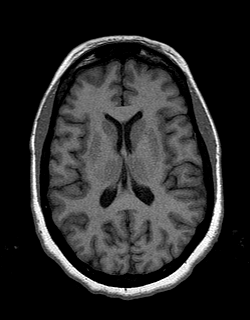
[im 96/144]
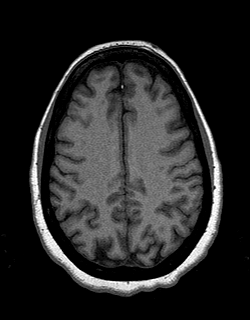
[im 112/144]
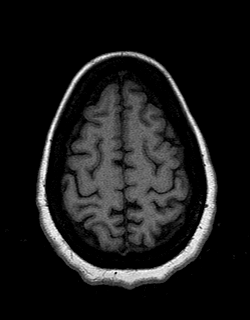
[im 128/144]
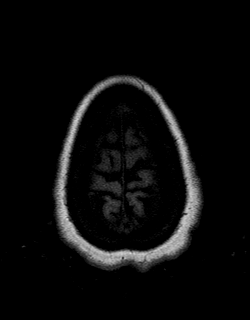
[im 144/144]
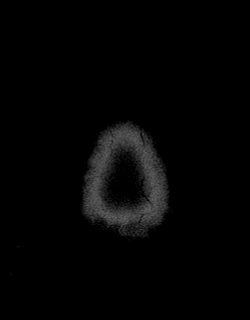

[Series 12: T2 · coronal · 5.0mm · 0.45mm/px · 2 of 27 slices shown (2 of 2)]
[im 1/27]
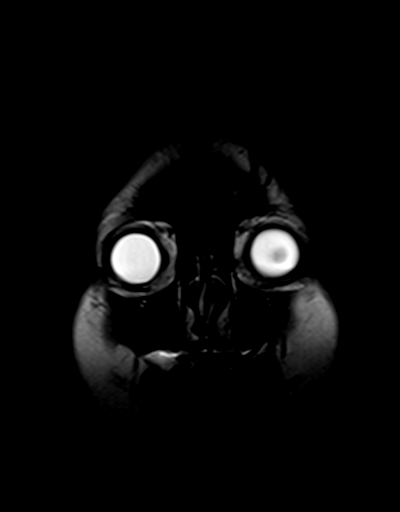
[im 27/27]
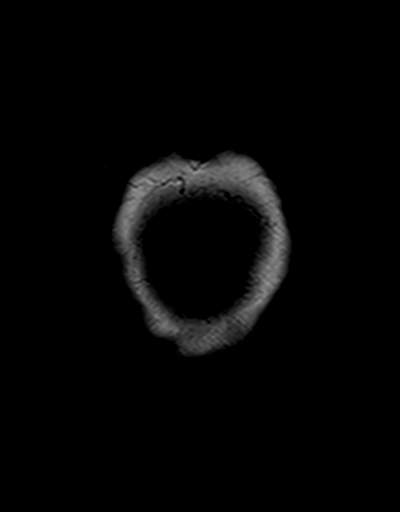

[Series 13: t1_mpr_tra post · axial · 1.1mm · 0.75mm/px · z∈[-48,+102]mm · 10 of 144 slices shown]
[im 1/144]
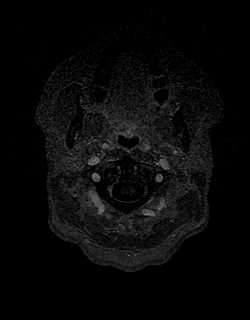
[im 16/144]
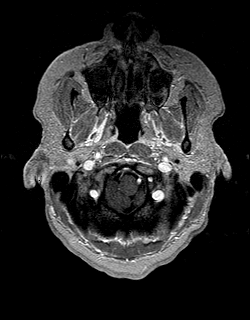
[im 32/144]
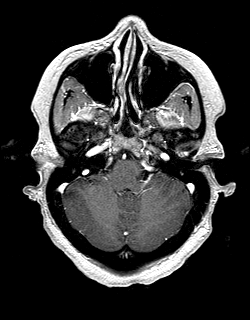
[im 48/144]
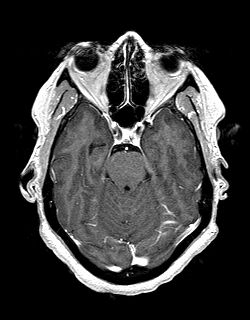
[im 64/144]
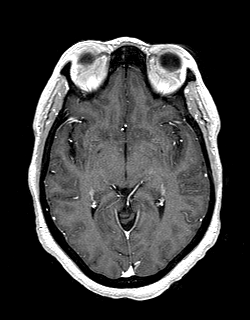
[im 80/144]
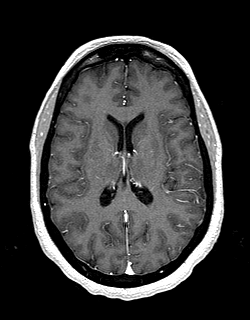
[im 96/144]
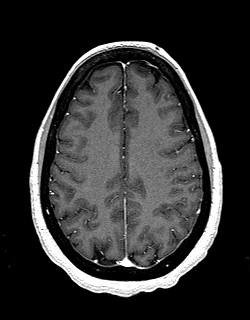
[im 112/144]
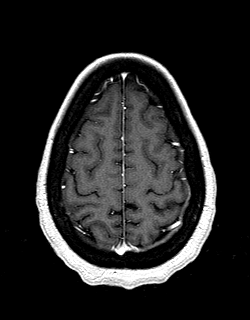
[im 128/144]
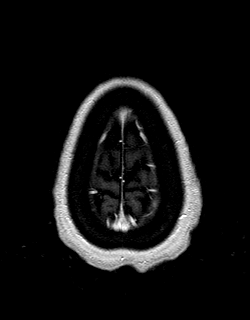
[im 144/144]
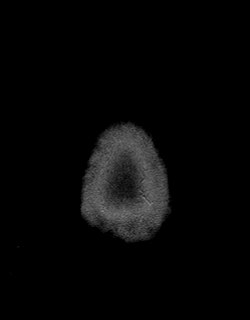

[Series 14: post cor · coronal · 5.0mm · 0.45mm/px · 2 of 27 slices shown]
[im 1/27]
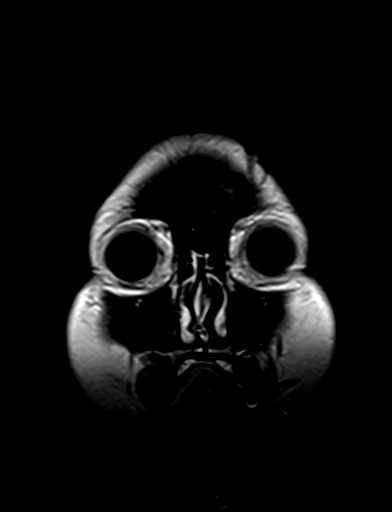
[im 27/27]
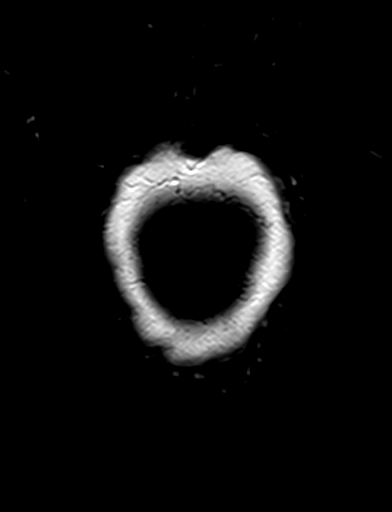

[48 of 48 positions shown; findings below may reference images not displayed]

FINDINGS: MRI HEAD

Brain: There is no acute infarction or intracranial hemorrhage.
There is no intracranial mass, mass effect, or edema. There is no
hydrocephalus or extra-axial fluid collection. Ventricles and sulci
are normal in size and configuration. A few small foci of T2
hyperintensity are present in the supratentorial white matter likely
reflecting minimal nonspecific gliosis/demyelination. No abnormal
enhancement.

Vascular: Major vessel flow voids at the skull base are preserved.

Skull and upper cervical spine: Normal marrow signal is preserved.

Sinuses/Orbits: Minor mucosal thickening.  Orbits are unremarkable.

Other: Sella is unremarkable.  Mastoid air cells are clear.

MRA HEAD

Anterior circulation: Intracranial internal carotid arteries are
patent. Anterior and middle cerebral arteries are patent.

Posterior circulation: Intracranial vertebral arteries are patent.
Basilar artery is patent. Major cerebellar artery origins are
patent. Posterior communicating arteries are present bilaterally.
Posterior cerebral arteries are patent.

Other: No aneurysm.
IMPRESSION: No evidence of recent infarction, hemorrhage, or mass. No abnormal
enhancement.

Unremarkable vascular imaging.

## 2020-09-08 IMAGING — MR MR MRA HEAD W/O CM
1 series · 22 of 48 positions shown · IV contrast (multihance)
Comparison: None.

CLINICAL DATA: New onset headache

EXAM:
MRI HEAD WITHOUT AND WITH CONTRAST
MRA HEAD WITHOUT CONTRAST
TECHNIQUE: Multiplanar, multi-echo pulse sequences of the brain and surrounding
structures were acquired without and with intravenous contrast.
Angiographic images of the Circle of Willis were acquired using MRA
technique without intravenous contrast.
CONTRAST:  16mL MULTIHANCE GADOBENATE DIMEGLUMINE 529 MG/ML IV SOLN

[Series 3: tof_3d_multi-slab · axial · 0.7mm · 0.35mm/px · z∈[-45,+59]mm · 22 of 156 slices shown]
[im 1/156]
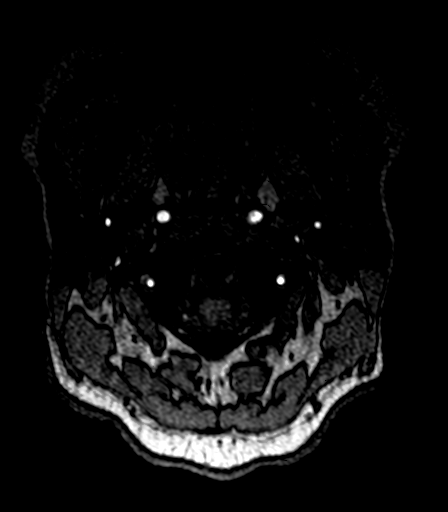
[im 4/156]
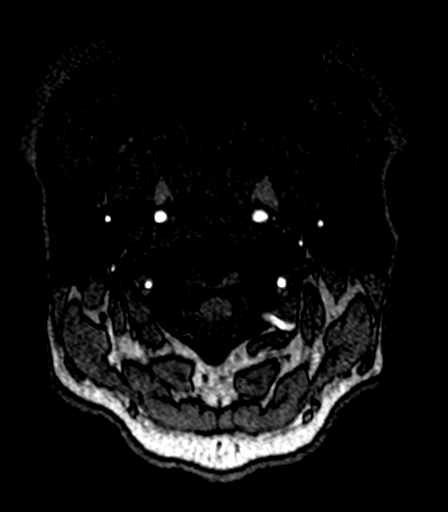
[im 7/156]
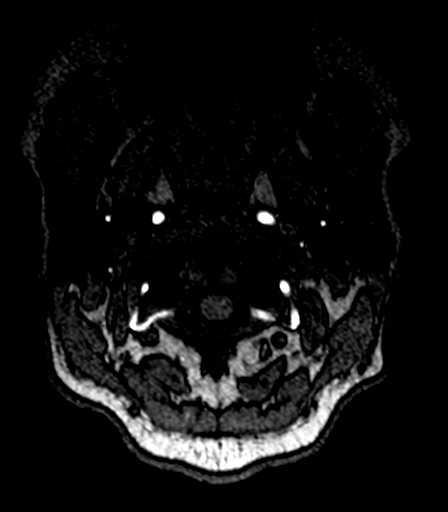
[im 10/156]
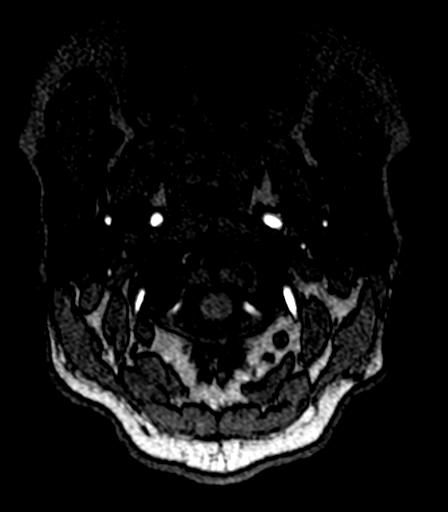
[im 14/156]
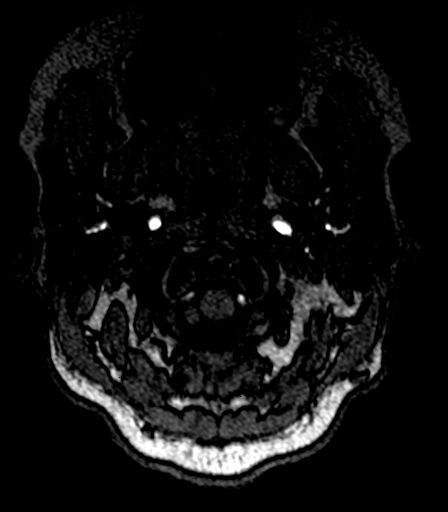
[im 17/156]
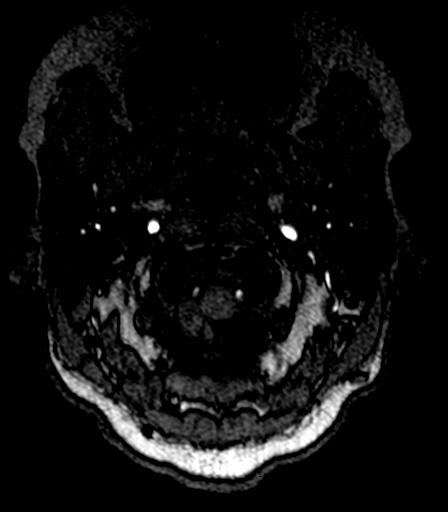
[im 20/156]
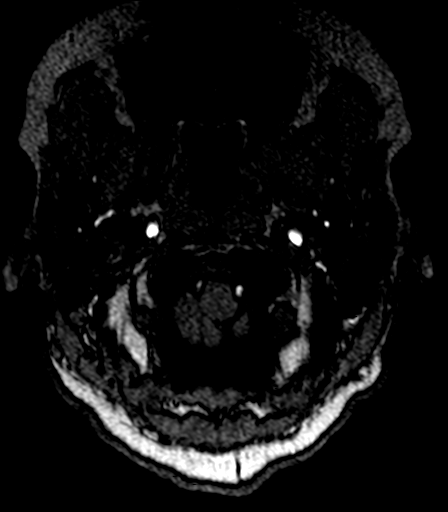
[im 24/156]
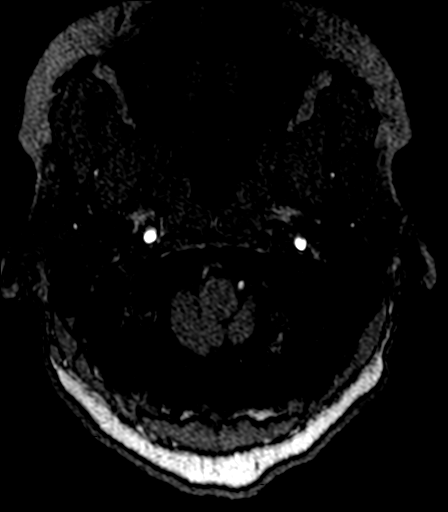
[im 27/156]
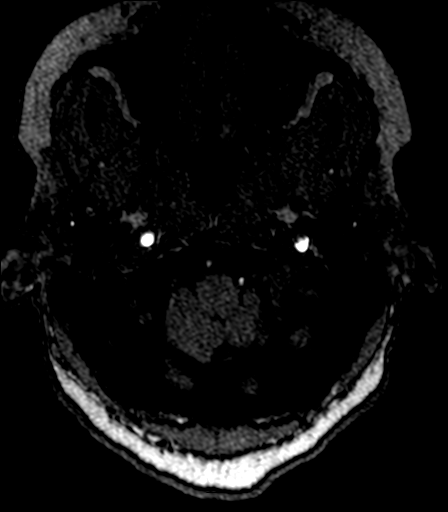
[im 30/156]
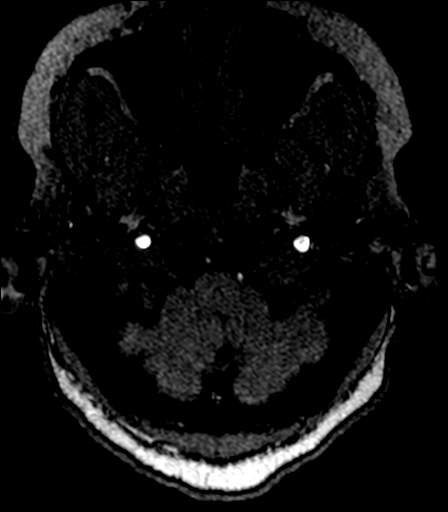
[im 33/156]
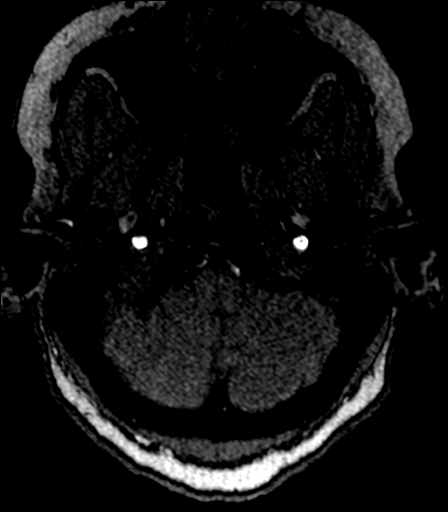
[im 37/156]
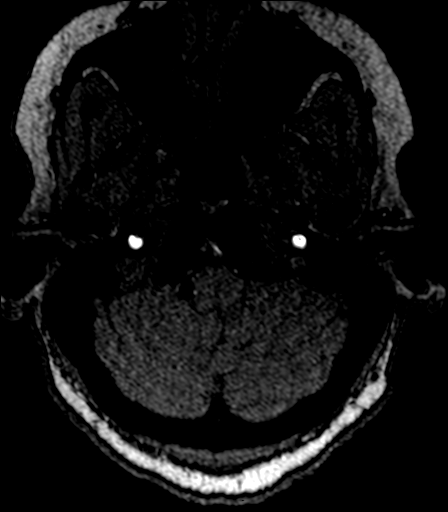
[im 40/156]
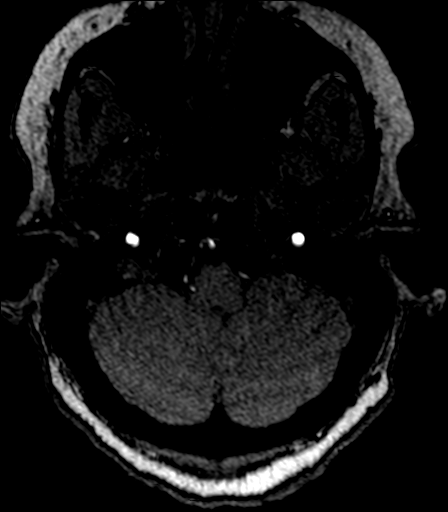
[im 43/156]
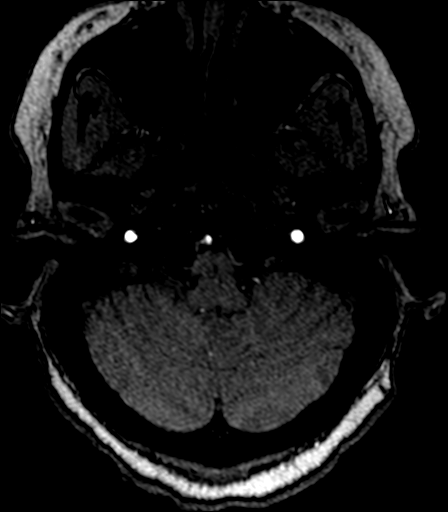
[im 50/156]
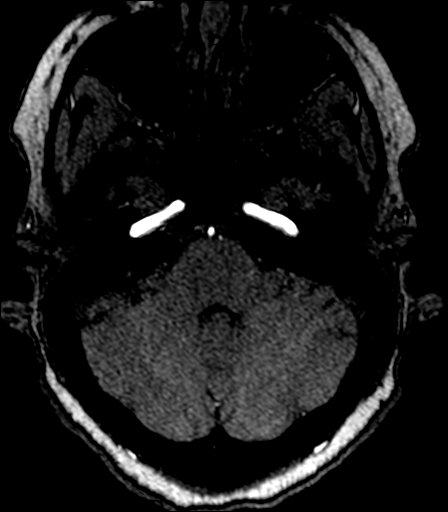
[im 70/156]
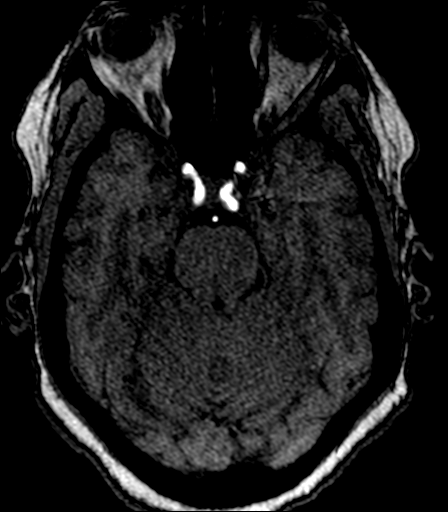
[im 80/156]
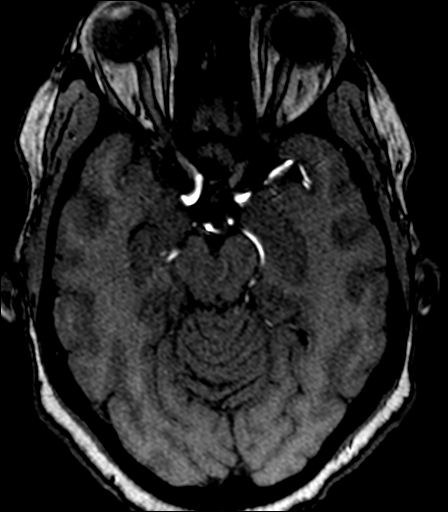
[im 90/156]
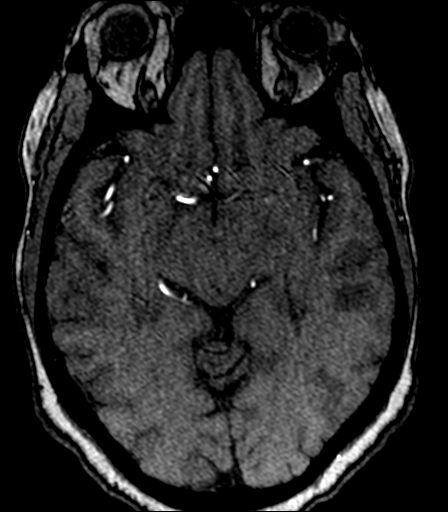
[im 109/156]
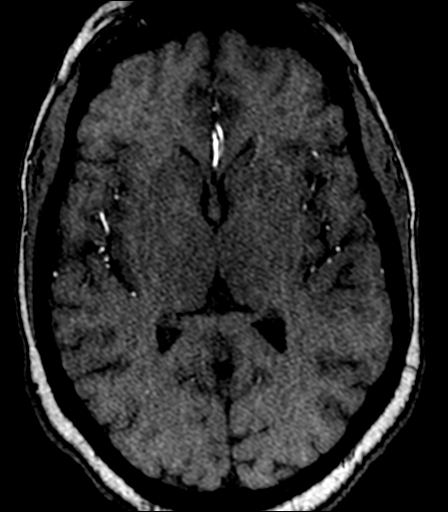
[im 129/156]
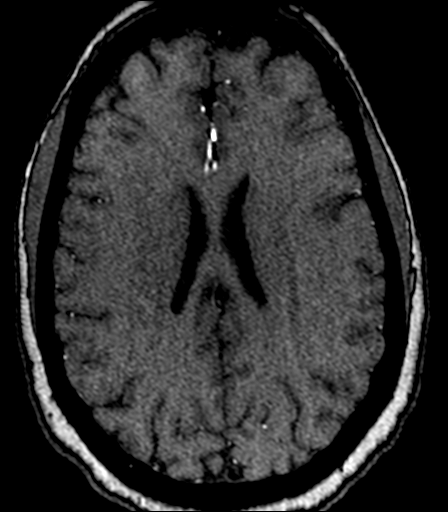
[im 132/156]
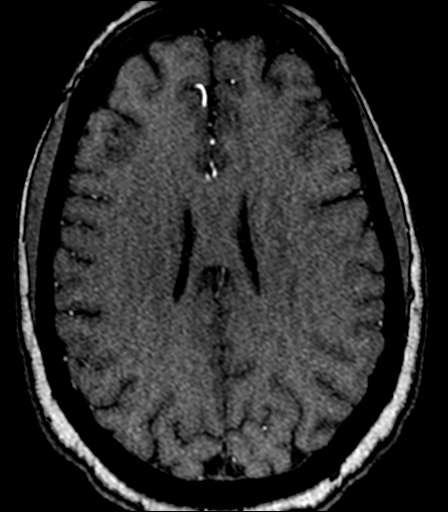
[im 149/156]
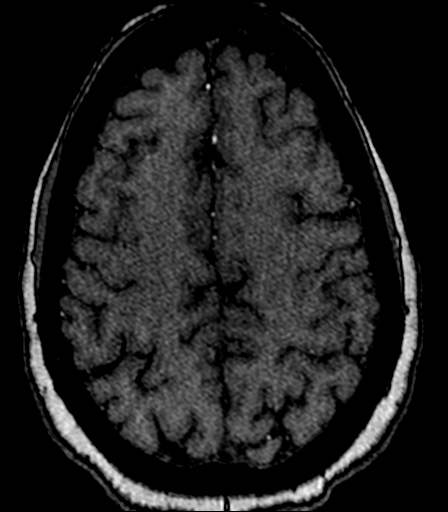

[22 of 48 positions shown; findings below may reference images not displayed]

FINDINGS: MRI HEAD

Brain: There is no acute infarction or intracranial hemorrhage.
There is no intracranial mass, mass effect, or edema. There is no
hydrocephalus or extra-axial fluid collection. Ventricles and sulci
are normal in size and configuration. A few small foci of T2
hyperintensity are present in the supratentorial white matter likely
reflecting minimal nonspecific gliosis/demyelination. No abnormal
enhancement.

Vascular: Major vessel flow voids at the skull base are preserved.

Skull and upper cervical spine: Normal marrow signal is preserved.

Sinuses/Orbits: Minor mucosal thickening.  Orbits are unremarkable.

Other: Sella is unremarkable.  Mastoid air cells are clear.

MRA HEAD

Anterior circulation: Intracranial internal carotid arteries are
patent. Anterior and middle cerebral arteries are patent.

Posterior circulation: Intracranial vertebral arteries are patent.
Basilar artery is patent. Major cerebellar artery origins are
patent. Posterior communicating arteries are present bilaterally.
Posterior cerebral arteries are patent.

Other: No aneurysm.
IMPRESSION: No evidence of recent infarction, hemorrhage, or mass. No abnormal
enhancement.

Unremarkable vascular imaging.

## 2020-09-08 MED ORDER — GADOBENATE DIMEGLUMINE 529 MG/ML IV SOLN
16.0000 mL | Freq: Once | INTRAVENOUS | Status: AC | PRN
  Administered 2020-09-08: 16 mL via INTRAVENOUS

## 2020-09-17 ENCOUNTER — Ambulatory Visit (HOSPITAL_BASED_OUTPATIENT_CLINIC_OR_DEPARTMENT_OTHER): Payer: BC Managed Care – PPO

## 2020-09-20 ENCOUNTER — Other Ambulatory Visit (HOSPITAL_BASED_OUTPATIENT_CLINIC_OR_DEPARTMENT_OTHER): Payer: Self-pay

## 2020-09-20 MED FILL — Lisdexamfetamine Dimesylate Cap 60 MG: ORAL | 30 days supply | Qty: 30 | Fill #0 | Status: AC

## 2020-10-14 ENCOUNTER — Other Ambulatory Visit: Payer: Self-pay

## 2020-10-14 ENCOUNTER — Ambulatory Visit (HOSPITAL_BASED_OUTPATIENT_CLINIC_OR_DEPARTMENT_OTHER)
Admission: RE | Admit: 2020-10-14 | Discharge: 2020-10-14 | Disposition: A | Discharge status: Home / Self Care | Payer: 59 | Source: Ambulatory Visit | Attending: Family Medicine | Admitting: Family Medicine

## 2020-10-14 ENCOUNTER — Other Ambulatory Visit (HOSPITAL_COMMUNITY): Payer: Self-pay

## 2020-10-14 ENCOUNTER — Other Ambulatory Visit (HOSPITAL_BASED_OUTPATIENT_CLINIC_OR_DEPARTMENT_OTHER): Payer: Self-pay

## 2020-10-14 ENCOUNTER — Encounter (HOSPITAL_BASED_OUTPATIENT_CLINIC_OR_DEPARTMENT_OTHER): Payer: Self-pay

## 2020-10-14 DIAGNOSIS — Z1231 Encounter for screening mammogram for malignant neoplasm of breast: Secondary | ICD-10-CM | POA: Insufficient documentation

## 2020-10-14 IMAGING — MG MM DIGITAL SCREENING BILAT W/ TOMO AND CAD
8 series · 8 of 24 positions shown · non-contrast
Comparison: Previous exam(s).

CLINICAL DATA: Screening.

EXAM:
DIGITAL SCREENING BILATERAL MAMMOGRAM WITH TOMOSYNTHESIS AND CAD
TECHNIQUE: Bilateral screening digital craniocaudal and mediolateral oblique
mammograms were obtained. Bilateral screening digital breast
tomosynthesis was performed. The images were evaluated with
computer-aided detection.

[L MLO synth-2D]
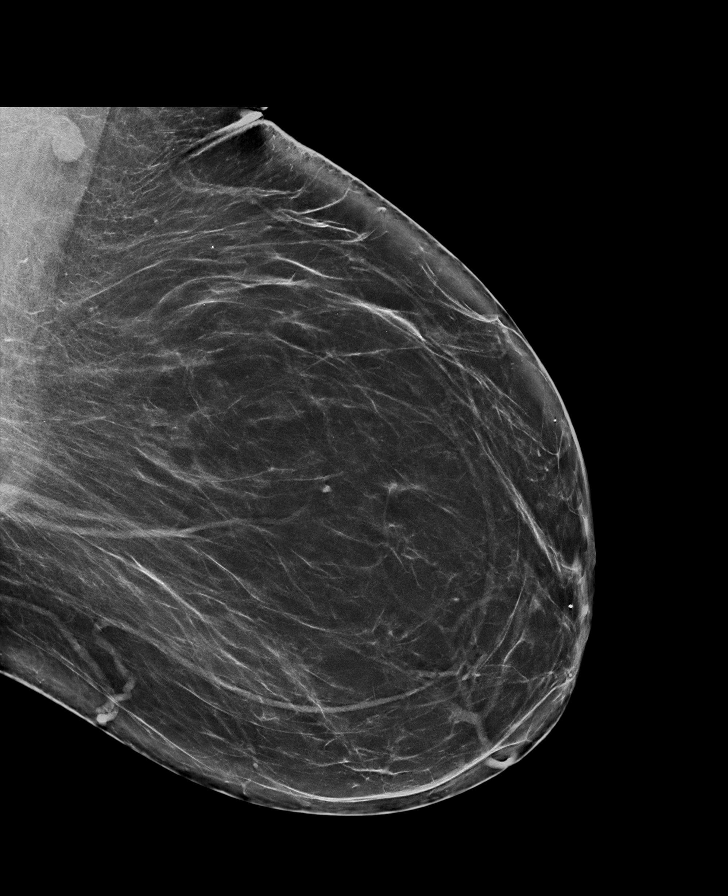

[R CC synth-2D]
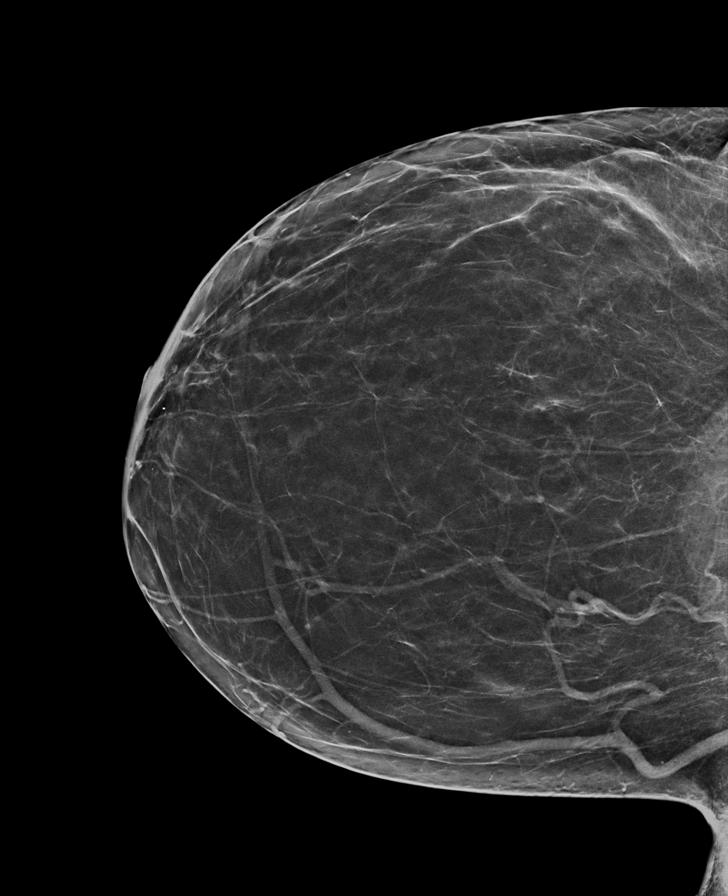

[L CC synth-2D]
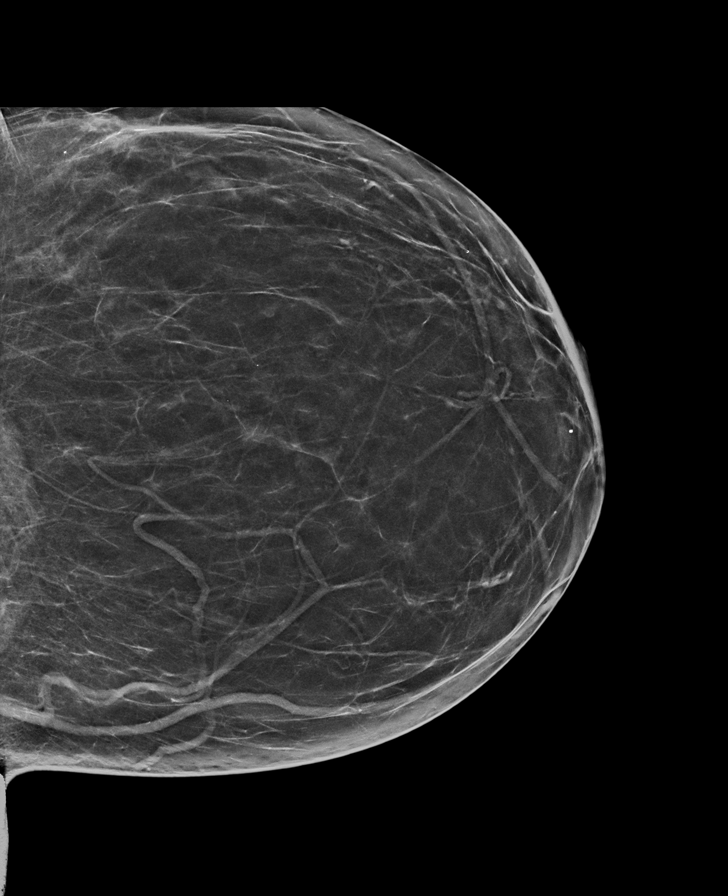

[R MLO synth-2D]
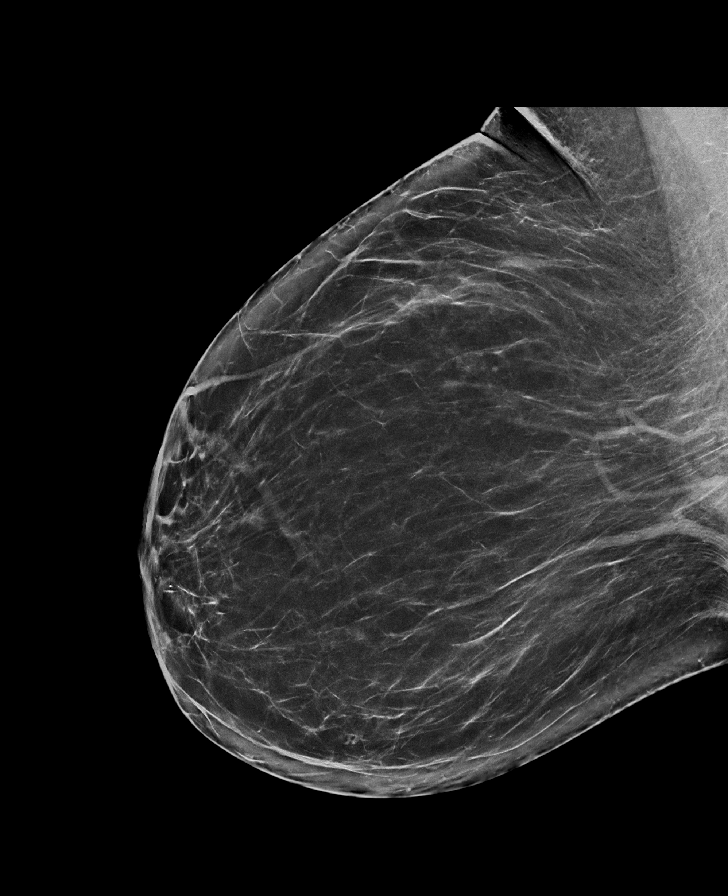

[L MLO tomo · tomo slice 42/83.0]
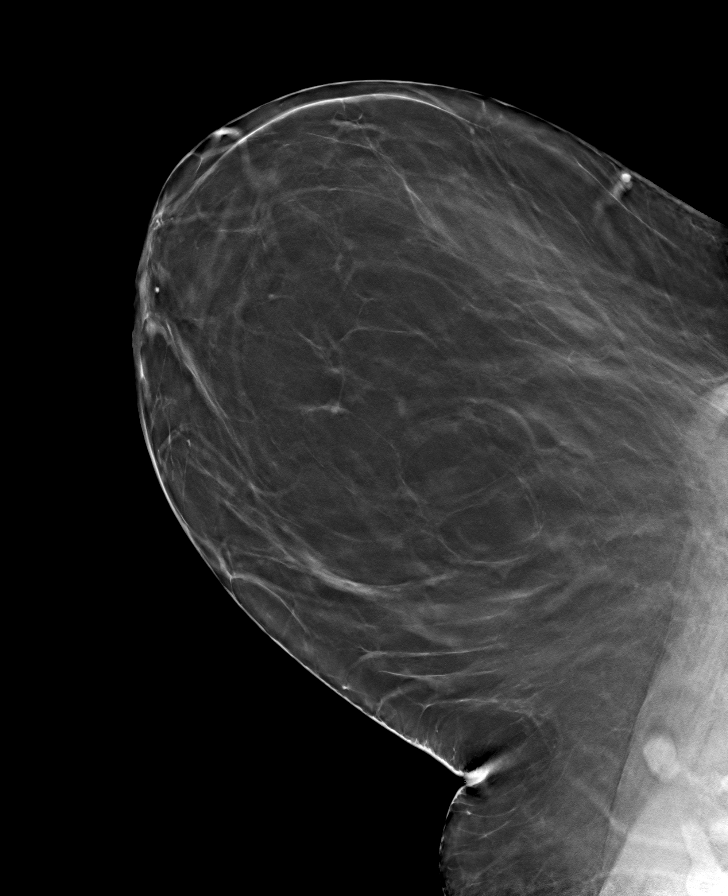

[R MLO tomo · tomo slice 41/81.0]
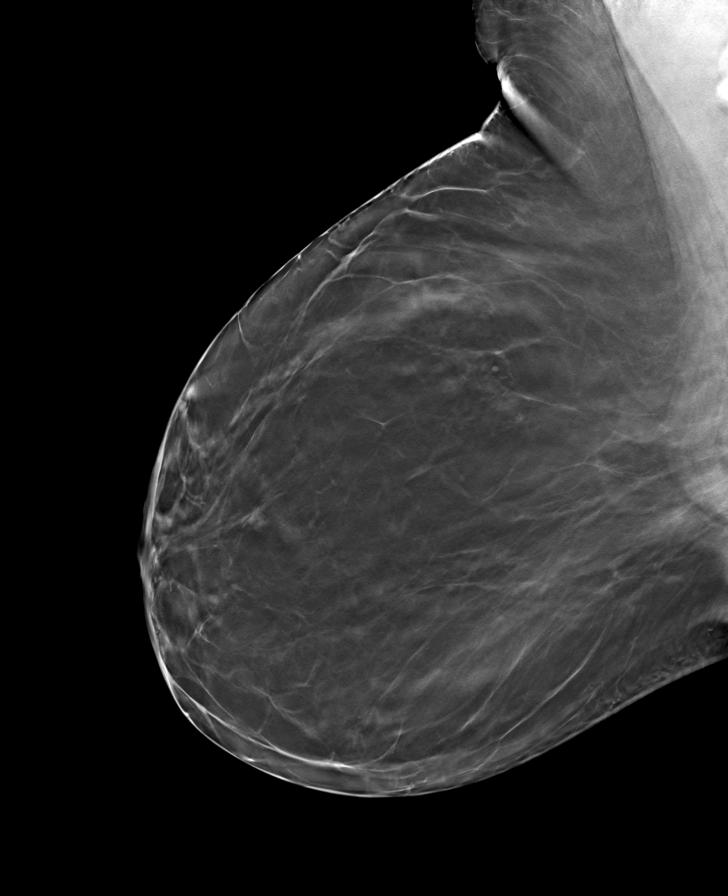

[L CC tomo · tomo slice 34/67.0]
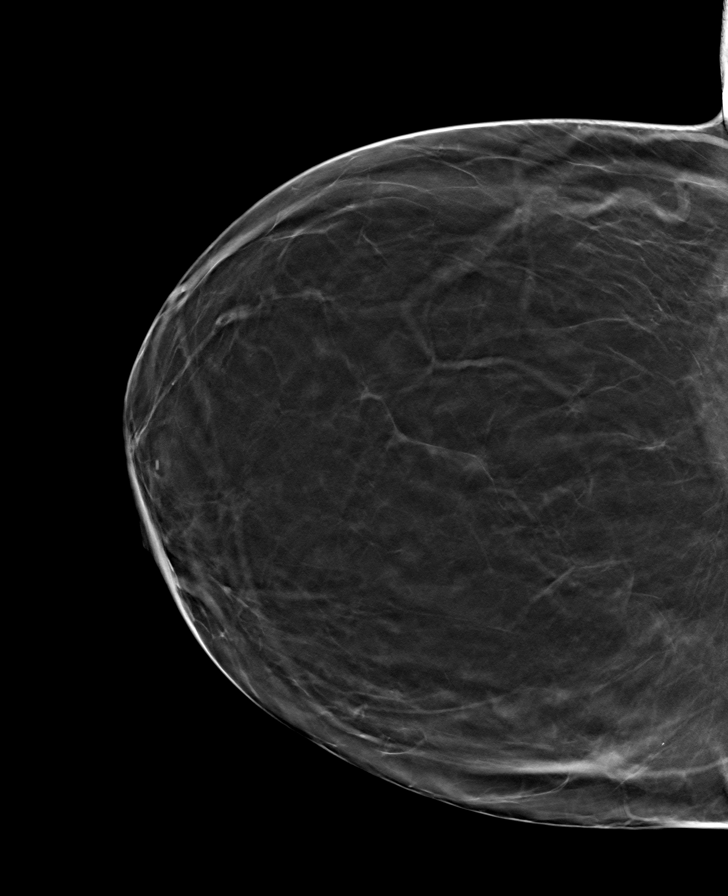

[R CC tomo · tomo slice 34/67.0]
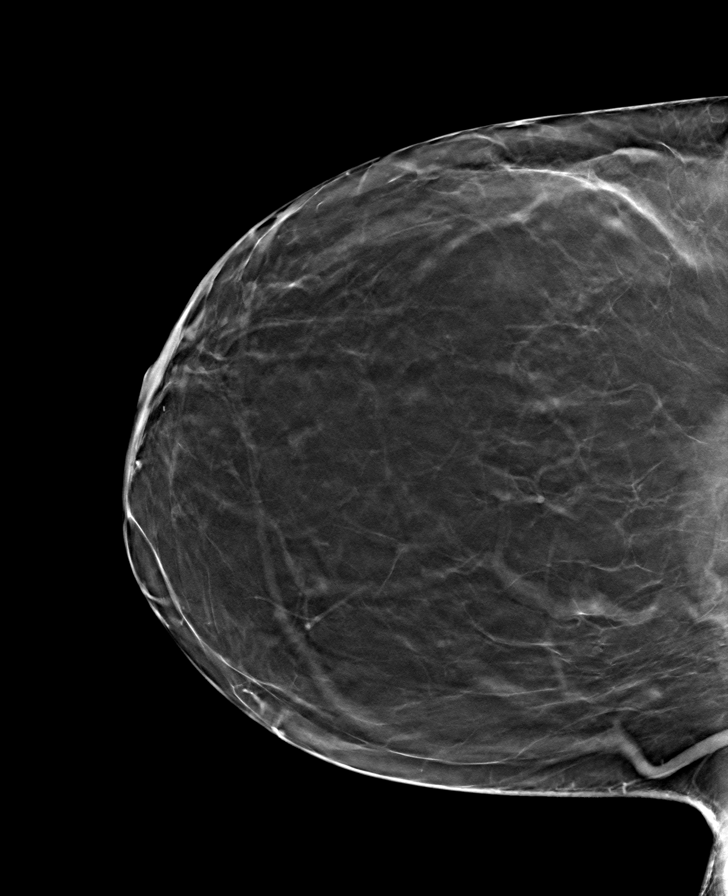

[8 of 24 positions shown; findings below may reference images not displayed]

ACR Breast Density Category b: There are scattered areas of
fibroglandular density.
FINDINGS: There are no findings suspicious for malignancy.
IMPRESSION: No mammographic evidence of malignancy. A result letter of this
screening mammogram will be mailed directly to the patient.

RECOMMENDATION:
Screening mammogram in one year. (Code:[BY])

BI-RADS CATEGORY  1: Negative.

## 2020-10-14 MED FILL — Sitagliptin-Metformin HCl Tab ER 24HR 50-500 MG: ORAL | 30 days supply | Qty: 60 | Fill #2 | Status: CN

## 2020-10-14 MED FILL — Sitagliptin Phosphate-Metformin HCl Tab ER 24HR 50-500 MG: ORAL | 30 days supply | Qty: 60 | Fill #2 | Status: CN

## 2020-10-22 ENCOUNTER — Other Ambulatory Visit (HOSPITAL_COMMUNITY): Payer: Self-pay

## 2020-10-28 ENCOUNTER — Other Ambulatory Visit (HOSPITAL_COMMUNITY): Payer: Self-pay

## 2020-10-29 ENCOUNTER — Other Ambulatory Visit (HOSPITAL_COMMUNITY): Payer: Self-pay

## 2020-10-29 MED ORDER — VYVANSE 60 MG PO CAPS
60.0000 mg | ORAL_CAPSULE | Freq: Every morning | ORAL | 0 refills | Status: DC
  Filled 2021-01-27: qty 30, 30d supply, fill #0

## 2020-10-29 MED ORDER — VYVANSE 60 MG PO CAPS
60.0000 mg | ORAL_CAPSULE | Freq: Every morning | ORAL | 0 refills | Status: DC
  Filled 2020-10-29: qty 30, 30d supply, fill #0

## 2020-10-29 MED ORDER — VYVANSE 60 MG PO CAPS
60.0000 mg | ORAL_CAPSULE | Freq: Every morning | ORAL | 0 refills | Status: DC
  Filled 2020-12-23: qty 30, 30d supply, fill #0

## 2020-10-29 MED FILL — Sitagliptin-Metformin HCl Tab ER 24HR 50-500 MG: ORAL | 30 days supply | Qty: 60 | Fill #2 | Status: AC

## 2020-11-01 NOTE — Progress Notes (Signed)
NEUROLOGY CONSULTATION NOTE  Tara House MRN: [id number] DOB: 12/20/1965  Referring provider: Aliene Beams, MD Primary care provider: Elias Else, MD  Reason for consult:  headaches  Assessment/Plan:   Probable migraine without aura, without status migrainosus, not intractable.  The facial swelling may be an autonomic symptom that can accompany migraine.  Trigeminal neuralgia less likely given her symptoms.  Not consistent with TMJ dysfunction.  As headaches significantly improved, would defer checking labs (sed rate, CRP) for temporal arteritis. Elevated blood pressure  Migraine prevention:  as she is doing well with topiramate, may continue 50mg  3-4 days a week for now. Migraine rescue:  She would like to try something less sedating than a triptan.  Will have her try Nurtec Limit use of pain relievers (sumatriptan, Excedrin) to no more than 2 days out of week to prevent risk of rebound or medication-overuse headache.  Advised to avoid Fioricet if possible. Keep headache diary Follow up with PCP regarding blood pressure. Follow up 6 months.    Subjective:  Tara House is a 55 year old right-handed female with HTN and depression who presents for headaches.  History supplemented by referring provider's note.  MRI and MRA of head personally reviewed.  After receiving the second COVID vaccine 59) on 11/13/2020, she developed fever and swollen lymph nodes under her left arm.  She was out of work for 2 weeks.  She started having frequent headaches soon afterwards.  She has history of migraines that were infrequent and associated with menses.  Headaches following the vaccine are different.  They are severe left sided posterior pressure pain with left sided facial swelling with associated dull throbbing as well as photophobia and phonophobia but no nausea, vomiting, visual disturbance, facial numbness/tingling, or rash.  Initially they were daily and persistent.   Now they last until she sleeps it off after 3-4 hours.  They occur 1-2 days every 2-3 days.  No preceding ear infection, TMJ or tooth abscess.  MRI of brain with and without contrast and MRA of head on 09/08/2020 were unremarkable.  Current NSAIDS/analgesics:  Fioricet, Excedrin Migraine Current triptans:  sumatriptan 100mg  Current ergotamine:  none Current anti-emetic:  none Current muscle relaxants:  none Current Antihypertensive medications:  losartan-HCTZ Current Antidepressant medications:  Vyvanse Current Anticonvulsant medications:  topiramate 50mg  QHS PRN (maybe 3-4 a days a week - makes her drowsy in the morning) Current anti-CGRP:  none Current Vitamins/Herbal/Supplements:  turmeric Current Antihistamines/Decongestants:  none Other therapy:  none Hormone/birth control:  none  Past NSAIDS/analgesics:  Tylenol, Midol Past abortive triptans:  none Past abortive ergotamine:  none Past muscle relaxants:  none Past anti-emetic:  none Past antihypertensive medications:  propranolol, losartan Past antidepressant medications:  none Past anticonvulsant medications:  none Past anti-CGRP:  none Past vitamins/Herbal/Supplements:  none Past antihistamines/decongestants:  none Other past therapies:  none  Caffeine:  One 21 oz cup of coffee daily Depression:  no; Anxiety:  no Other pain:  no Sleep hygiene:  usually okay Family history of headache:  No      PAST MEDICAL HISTORY: Past Medical History:  Diagnosis Date   Hypertension     PAST SURGICAL HISTORY: Past Surgical History:  Procedure Laterality Date   THYROID SURGERY     TONSILLECTOMY     TUBAL LIGATION      MEDICATIONS: Current Outpatient Medications on File Prior to Visit  Medication Sig Dispense Refill   amLODipine (NORVASC) 5 MG tablet Take 5 mg by mouth daily.  amoxicillin (AMOXIL) 500 MG capsule take 1 capsule (500 mg) by mouth every 8 hours until gone 21 capsule 0   amoxicillin-clavulanate  (AUGMENTIN) 875-125 MG tablet Take 1 tablet by mouth 2 times daily for 10 days. 20 tablet 0   Butalbital-APAP-Caffeine 50-300-40 MG CAPS TAKE 1 CAPSULE BY MOUTH EVERY 4 HOURS AS NEEDED FOR HEADACHE PAIN 30 capsule 0   ferrous sulfate (SLOW IRON) 160 (50 Fe) MG TBCR SR tablet Take by mouth.     hydrochlorothiazide (,MICROZIDE/HYDRODIURIL,) 12.5 MG capsule Take 12.5 mg by mouth daily.       ibuprofen (ADVIL,MOTRIN) 800 MG tablet Take by mouth.     lisdexamfetamine (VYVANSE) 60 MG capsule TAKE 1 CAPSULE BY MOUTH EVERY MORNING 30 capsule 0   lisdexamfetamine (VYVANSE) 60 MG capsule TAKE 1 CAPSULE BY MOUTH EVERY MORNING 30 capsule 0   lisdexamfetamine (VYVANSE) 60 MG capsule TAKE 1 CAPSULE BY MOUTH EVERY MORNING 30 capsule 0   lisdexamfetamine (VYVANSE) 60 MG capsule TAKE 1 CAPSULE BY MOUTH EVERY MORNING 30 capsule 0   lisdexamfetamine (VYVANSE) 60 MG capsule TAKE 1 CAPSULE BY MOUTH EVERY MORNING **06/18/20** 30 capsule 0   lisdexamfetamine (VYVANSE) 60 MG capsule TAKE 1 CAPSULE BY MOUTH EVERY MORNING 30 capsule 0   lisdexamfetamine (VYVANSE) 60 MG capsule Take 1 capsule by mouth daily 30 capsule 0   lisdexamfetamine (VYVANSE) 60 MG capsule Take 1 capsule by mouth every morning every morning - Please fill 30 days after the previous Vyvanse fill 30 capsule 0   lisdexamfetamine (VYVANSE) 60 MG capsule Take 1 capsule by mouth every morning every morning - Please fill 30 days after the previous Vyvanse fill 30 capsule 0   lisdexamfetamine (VYVANSE) 60 MG capsule Take 1 capsule (60 mg total) by mouth every morning. 30 capsule 0   [START ON 11/28/2020] lisdexamfetamine (VYVANSE) 60 MG capsule Take 1 capsule (60 mg total) by mouth in the morning. 30 capsule 0   [START ON 12/28/2020] lisdexamfetamine (VYVANSE) 60 MG capsule Take 1 capsule (60 mg total) by mouth in the morning. 30 capsule 0   losartan (COZAAR) 100 MG tablet Take 100 mg by mouth daily.       losartan-hydrochlorothiazide (HYZAAR) 100-25 MG tablet  TAKE 1 TABLET BY MOUTH EVERY MORNING 90 tablet 1   losartan-hydrochlorothiazide (HYZAAR) 100-25 MG tablet TAKE 1 TABLET BY MOUTH EVERY MORNING 90 tablet 1   losartan-hydrochlorothiazide (HYZAAR) 100-25 MG tablet Take one tablet by mouth every morning. 90 tablet 1   Multiple Vitamin (MULTIVITAMIN) tablet Take by mouth.     predniSONE (DELTASONE) 20 MG tablet TAKE 3 TABLETS BY MOUTH DAILY FOR 2 DAYS, 2 TABS DAILY FOR 2 DAYS, 1 TAB DAILY FOR 2 DAYS,TAKE WITH FOOD IN THE MORNING 12 tablet 0   propranolol ER (INDERAL LA) 120 MG 24 hr capsule Take 1 capsule by mouth once at bedtime 90 capsule 0   rosuvastatin (CRESTOR) 10 MG tablet TAKE 1 TABLET BY MOUTH ONCE A WEEK 13 tablet 3   rosuvastatin (CRESTOR) 10 MG tablet take 1 tablet by mouth once a week 13 tablet 2   SitaGLIPtin-MetFORMIN HCl (JANUMET XR) 50-500 MG TB24 Take one tablet by mouth twice daily with meals. 180 tablet 1   SitaGLIPtin-MetFORMIN HCl 50-500 MG TB24 TAKE 1 TABLET BY MOUTH TWICE A DAY WITH MEALS 180 tablet 1   SUMAtriptan (IMITREX) 100 MG tablet TAKE 1 TABLET BY MOUTH IMMEDIATELY AT ONSET OF HEADACHE, MAY REPEAT IN 2 HOURS FOR PERSISTENT HEADACHE PAIN 9 tablet 3  topiramate (TOPAMAX) 25 MG tablet Take 1 tablet by mouth once at bedtime for 2 weeks then increase to 2 tablets at bedtime 42 tablet 0   triamcinolone (KENALOG) 0.1 % APPLY 1 APPLICATION EXTERNALLY TWO TIMES A WEEK AS NEEDED 30 g 2   VYVANSE 30 MG capsule   0   VYVANSE 60 MG capsule      No current facility-administered medications on file prior to visit.    ALLERGIES: Allergies  Allergen Reactions   Latex     FAMILY HISTORY: No family history on file.  Objective:  Blood pressure (!) 171/115, pulse 95, height 5\' 3"  (1.6 m), weight 183 lb (83 kg), SpO2 98 %. General: No acute distress.  Patient appears well-groomed.   Head:  Normocephalic/atraumatic Eyes:  fundi examined but not visualized Neck: supple, no paraspinal tenderness, full range of motion Back: No  paraspinal tenderness Heart: regular rate and rhythm Lungs: Clear to auscultation bilaterally. Vascular: No carotid bruits. Neurological Exam: Mental status: alert and oriented to person, place, and time, recent and remote memory intact, fund of knowledge intact, attention and concentration intact, speech fluent and not dysarthric, language intact. Cranial nerves: CN I: not tested CN II: pupils equal, round and reactive to light, visual fields intact CN III, IV, VI:  full range of motion, no nystagmus, no ptosis CN V: facial sensation intact. CN VII: upper and lower face symmetric CN VIII: hearing intact CN IX, X: gag intact, uvula midline CN XI: sternocleidomastoid and trapezius muscles intact CN XII: tongue midline Bulk & Tone: normal, no fasciculations. Motor:  muscle strength 5/5 throughout Sensation:  Pinprick, temperature and vibratory sensation intact. Deep Tendon Reflexes:  2+ throughout,  toes downgoing.   Finger to nose testing:  Without dysmetria.   Heel to shin:  Without dysmetria.   Gait:  Normal station and stride.  Romberg negative.    Thank you for allowing me to take part in the care of this patient.  , DO  CC:  Shon Millet, MD  Aliene Beams, MD

## 2020-11-05 ENCOUNTER — Ambulatory Visit (INDEPENDENT_AMBULATORY_CARE_PROVIDER_SITE_OTHER): Payer: 59 | Admitting: Neurology

## 2020-11-05 ENCOUNTER — Other Ambulatory Visit: Payer: Self-pay

## 2020-11-05 ENCOUNTER — Encounter: Payer: Self-pay | Admitting: Neurology

## 2020-11-05 VITALS — BP 166/87 | HR 95 | Ht 63.0 in | Wt 183.0 lb

## 2020-11-05 DIAGNOSIS — G43009 Migraine without aura, not intractable, without status migrainosus: Secondary | ICD-10-CM

## 2020-11-05 DIAGNOSIS — R030 Elevated blood-pressure reading, without diagnosis of hypertension: Secondary | ICD-10-CM | POA: Diagnosis not present

## 2020-11-05 NOTE — Patient Instructions (Signed)
Continue topiramate Next time you get a migraine, try Nurtec (no more than 1 in 24 hours).  If effective, contact me for prescription Limit use of pain relievers (such as sumatriptan, Excedrin and Fioricet) to no more than 2 days out of week to prevent risk of rebound or medication-overuse headache.  Try not to take fioricet if possible as it can easily cause rebound headache Keep headache diary. Follow up in 6 months.

## 2020-11-13 ENCOUNTER — Telehealth: Payer: Self-pay | Admitting: Allergy & Immunology

## 2020-11-13 ENCOUNTER — Other Ambulatory Visit (HOSPITAL_COMMUNITY): Payer: Self-pay

## 2020-11-13 MED ORDER — HYDROCODONE BIT-HOMATROP MBR 5-1.5 MG/5ML PO SOLN
5.0000 mL | Freq: Four times a day (QID) | ORAL | 0 refills | Status: DC | PRN
  Filled 2020-11-13 (×2): qty 100, 5d supply, fill #0

## 2020-11-13 NOTE — Telephone Encounter (Signed)
I called Tara House to see if she wanted me to send in Paxlovid for COVID treatment. Left voicemail asking her to call us back.  Malachi Bonds, MD [facility]

## 2020-11-15 ENCOUNTER — Other Ambulatory Visit: Payer: Self-pay

## 2020-11-15 ENCOUNTER — Emergency Department (HOSPITAL_BASED_OUTPATIENT_CLINIC_OR_DEPARTMENT_OTHER)
Admission: EM | Admit: 2020-11-15 | Discharge: 2020-11-15 | Disposition: A | Discharge status: Home / Self Care | Payer: 59 | Attending: Emergency Medicine | Admitting: Emergency Medicine

## 2020-11-15 ENCOUNTER — Emergency Department (HOSPITAL_BASED_OUTPATIENT_CLINIC_OR_DEPARTMENT_OTHER): Payer: 59 | Admitting: Radiology

## 2020-11-15 ENCOUNTER — Encounter (HOSPITAL_BASED_OUTPATIENT_CLINIC_OR_DEPARTMENT_OTHER): Payer: Self-pay | Admitting: *Deleted

## 2020-11-15 DIAGNOSIS — I10 Essential (primary) hypertension: Secondary | ICD-10-CM | POA: Insufficient documentation

## 2020-11-15 DIAGNOSIS — E119 Type 2 diabetes mellitus without complications: Secondary | ICD-10-CM | POA: Insufficient documentation

## 2020-11-15 DIAGNOSIS — Z79899 Other long term (current) drug therapy: Secondary | ICD-10-CM | POA: Diagnosis not present

## 2020-11-15 DIAGNOSIS — R0602 Shortness of breath: Secondary | ICD-10-CM | POA: Diagnosis present

## 2020-11-15 DIAGNOSIS — Z7984 Long term (current) use of oral hypoglycemic drugs: Secondary | ICD-10-CM | POA: Insufficient documentation

## 2020-11-15 DIAGNOSIS — Z91040 Latex allergy status: Secondary | ICD-10-CM | POA: Insufficient documentation

## 2020-11-15 DIAGNOSIS — R059 Cough, unspecified: Secondary | ICD-10-CM | POA: Diagnosis not present

## 2020-11-15 DIAGNOSIS — U071 COVID-19: Secondary | ICD-10-CM | POA: Diagnosis not present

## 2020-11-15 HISTORY — DX: Type 2 diabetes mellitus without complications: E11.9

## 2020-11-15 IMAGING — DX DG CHEST 2V
2 series · 2 of 2 positions shown · non-contrast
Comparison: Chest radiograph [DATE]

CLINICAL DATA: Cough, COVID positive

EXAM:
CHEST - 2 VIEW

[chest pa]
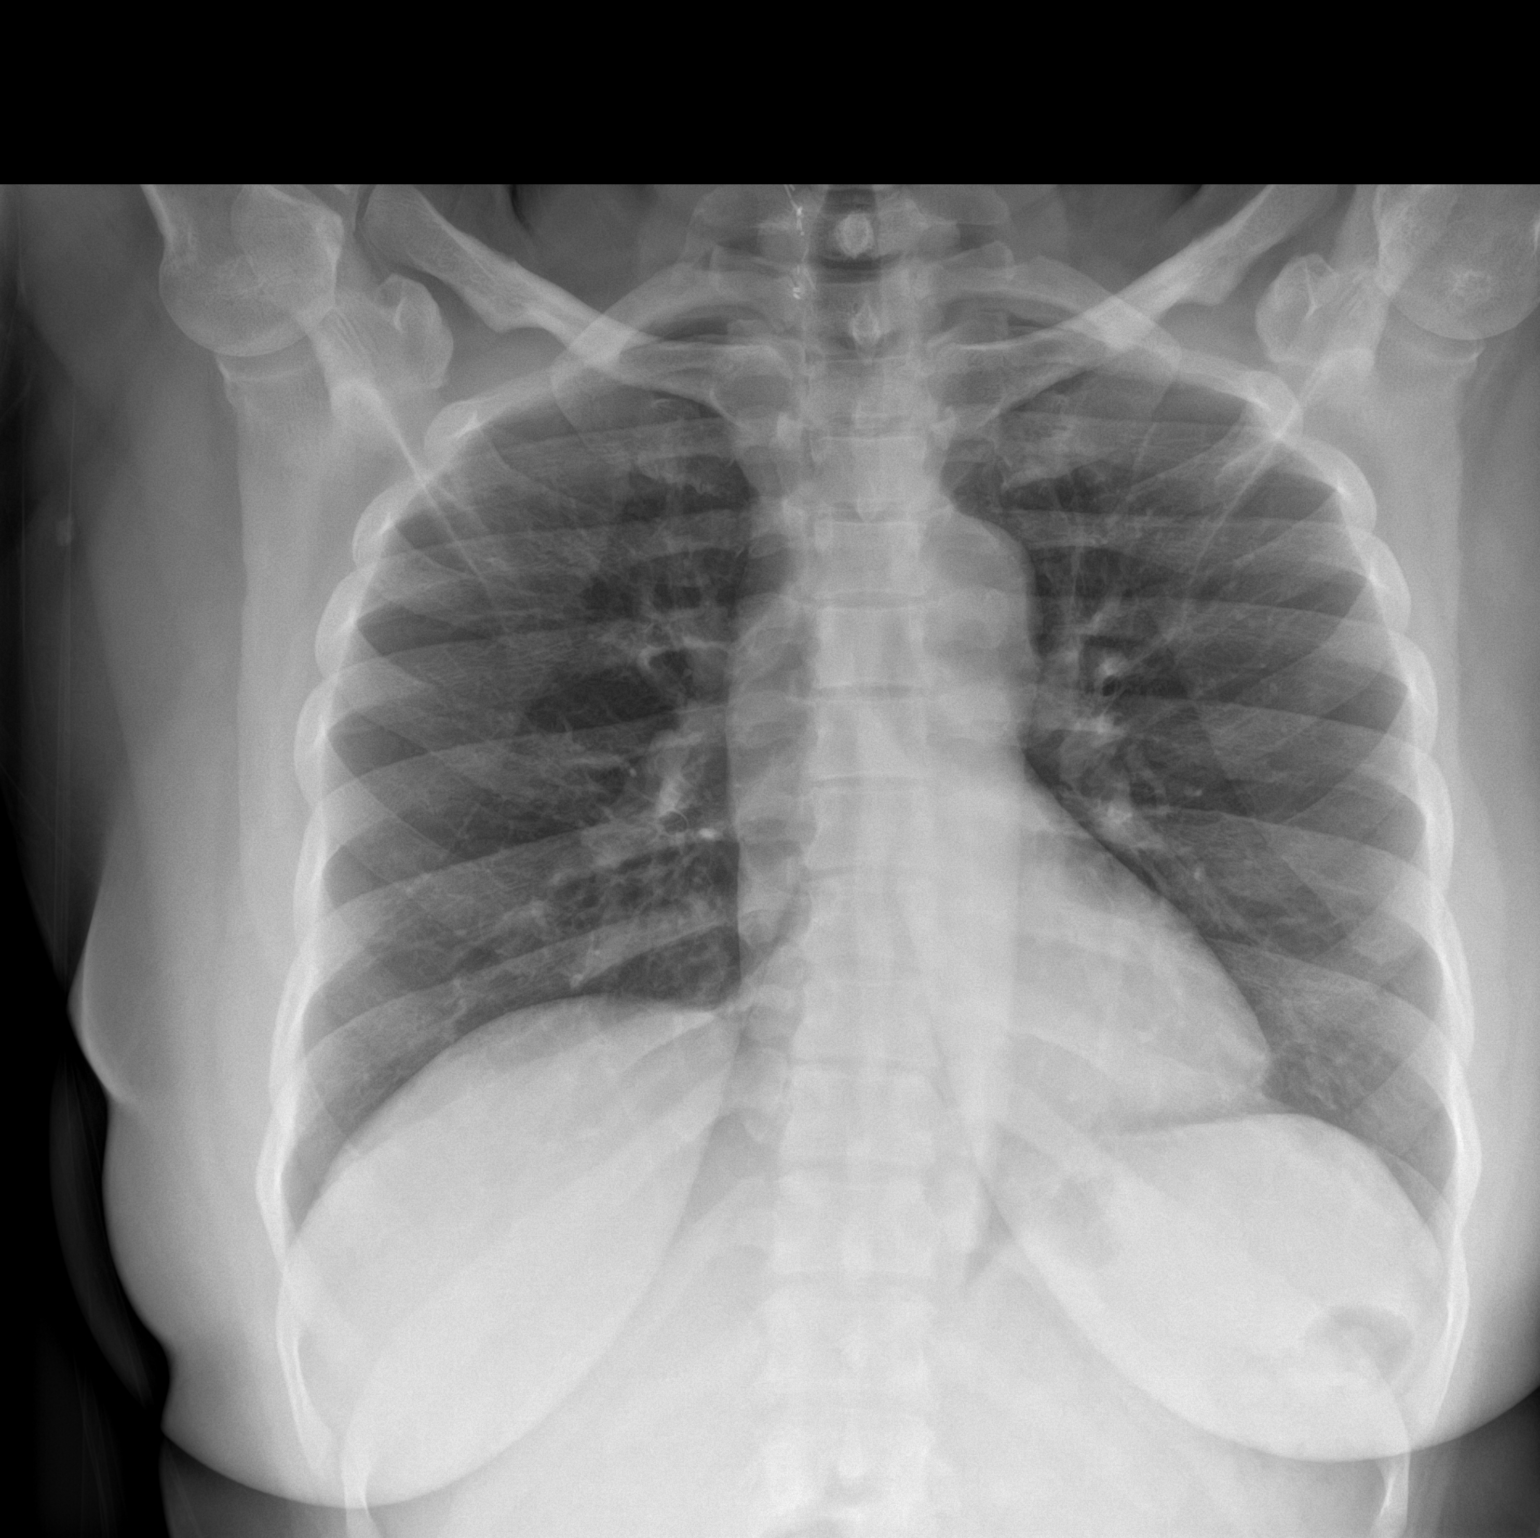

[chest lat]
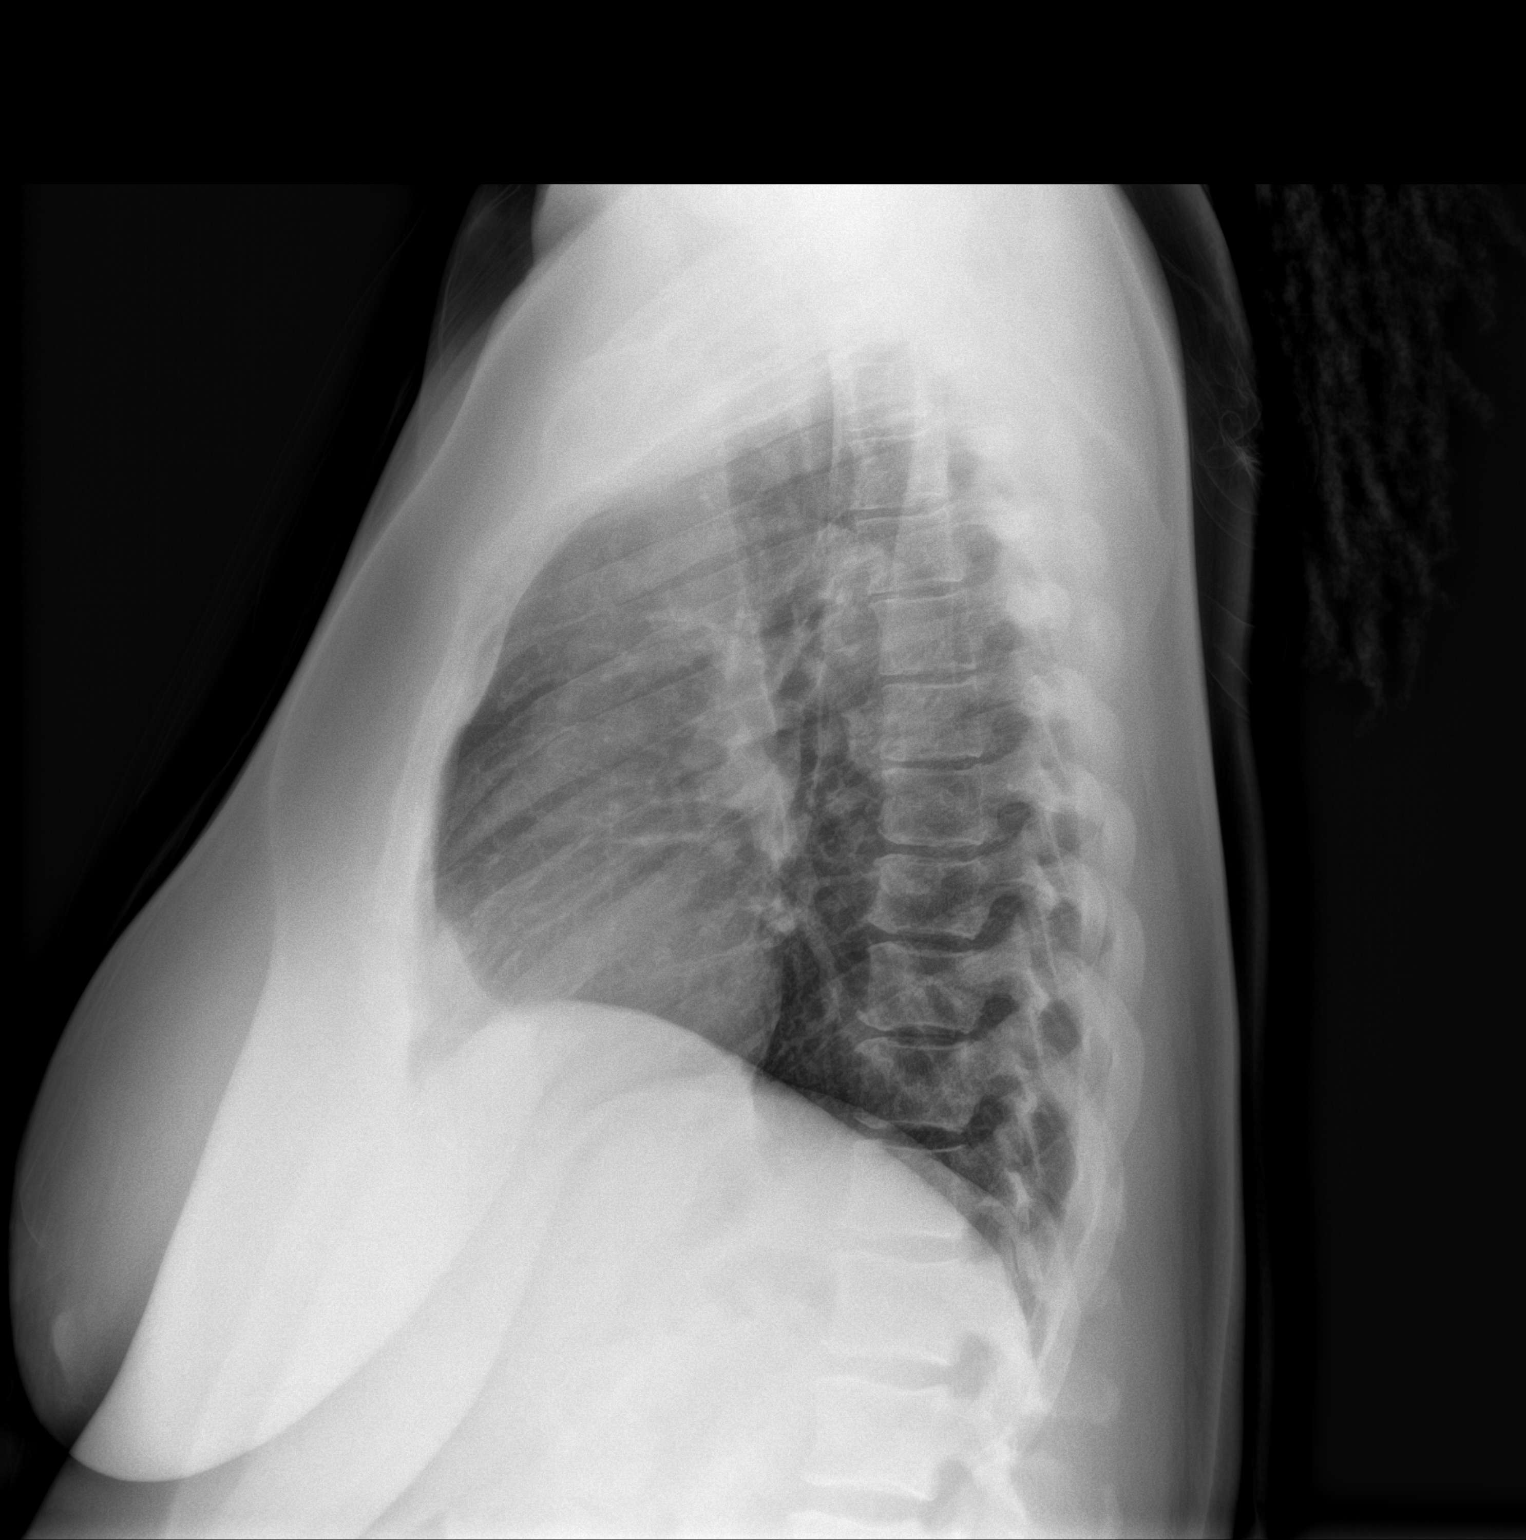

[2 of 2 positions shown; findings below may reference images not displayed]

FINDINGS: The cardiomediastinal silhouette is within normal limits.

There is no focal consolidation or pulmonary edema. There is no
pleural effusion or pneumothorax.

There is no acute osseous abnormality. Surgical clips are noted in
the right neck.
IMPRESSION: No radiographic evidence of acute cardiopulmonary process.

## 2020-11-15 MED ORDER — PREDNISONE 20 MG PO TABS
40.0000 mg | ORAL_TABLET | Freq: Once | ORAL | Status: AC
  Administered 2020-11-15: 40 mg via ORAL
  Filled 2020-11-15: qty 2

## 2020-11-15 MED ORDER — ALBUTEROL SULFATE HFA 108 (90 BASE) MCG/ACT IN AERS
2.0000 | INHALATION_SPRAY | Freq: Once | RESPIRATORY_TRACT | Status: AC
  Administered 2020-11-15: 2 via RESPIRATORY_TRACT
  Filled 2020-11-15: qty 6.7

## 2020-11-15 MED ORDER — PREDNISONE 20 MG PO TABS
ORAL_TABLET | ORAL | 0 refills | Status: DC
  Filled 2020-11-15: qty 8, fill #0

## 2020-11-15 MED ORDER — PREDNISONE 20 MG PO TABS: ORAL_TABLET | ORAL | 0 refills | Status: DC

## 2020-11-15 MED ORDER — AEROCHAMBER PLUS FLO-VU MISC
1.0000 | Freq: Once | Status: AC
  Administered 2020-11-15: 1
  Filled 2020-11-15: qty 1

## 2020-11-15 NOTE — ED Triage Notes (Signed)
Pt Covid + Sept 10 th, cough with shob, chest soreness with coughing.Small amt of yellow secretions started today.

## 2020-11-15 NOTE — Discharge Instructions (Signed)
Use your inhaler every 4 hours(2-4 puffs) while awake, return for sudden worsening shortness of breath, or if you need to use your inhaler more often.

## 2020-11-15 NOTE — ED Provider Notes (Signed)
[facility] EMERGENCY DEPT Provider Note   CSN: [id number] Arrival date & time: 11/15/20  1513     History Chief Complaint  Patient presents with   Cough   Shortness of Breath    Tara House is a 55 y.o. female.  55 yo F with a chief complaints of cough.  Patient was diagnosed with COVID has been sick for about a week now.  She feels like her work of breathing has gotten somewhat worse and her cough is not improved despite her got a cough syrup.  Feels like when she had had pneumonia in the past.  At that time she was started on steroids and antibiotics with improvement.    The history is provided by the patient.  Cough Associated symptoms: fever (now resolved) and shortness of breath   Associated symptoms: no chest pain, no chills, no headaches, no myalgias, no rhinorrhea and no wheezing   Shortness of Breath Associated symptoms: cough and fever (now resolved)   Associated symptoms: no chest pain, no headaches, no vomiting and no wheezing   Illness Severity:  Moderate Onset quality:  Gradual Duration:  1 week Timing:  Constant Progression:  Worsening Chronicity:  New Associated symptoms: cough, fever (now resolved) and shortness of breath   Associated symptoms: no chest pain, no congestion, no headaches, no myalgias, no nausea, no rhinorrhea, no vomiting and no wheezing       Past Medical History:  Diagnosis Date   Diabetes mellitus without complication (HCC)    Hypertension     Patient Active Problem List   Diagnosis Date Noted   Sprain of right ankle 04/18/2015    Past Surgical History:  Procedure Laterality Date   THYROID SURGERY     TONSILLECTOMY     TUBAL LIGATION       OB History   No obstetric history on file.     No family history on file.  Social History   Tobacco Use   Smoking status: Never   Smokeless tobacco: Never  Vaping Use   Vaping Use: Never used  Substance Use Topics   Alcohol use: No   Drug use: No     Home Medications Prior to Admission medications   Medication Sig Start Date End Date Taking? Authorizing Provider  HYDROcodone bit-homatropine (HYCODAN) 5-1.5 MG/5ML syrup Take 5 mLs by mouth every 6 (six) hours as needed. 11/13/20  Yes   lisdexamfetamine (VYVANSE) 60 MG capsule TAKE 1 CAPSULE BY MOUTH EVERY MORNING **06/18/20** 05/19/20 11/15/20 Yes Elias Else, MD  lisdexamfetamine (VYVANSE) 60 MG capsule TAKE 1 CAPSULE BY MOUTH EVERY MORNING 05/19/20 11/15/20 Yes Elias Else, MD  lisdexamfetamine (VYVANSE) 60 MG capsule Take 1 capsule by mouth daily 07/07/20  Yes   lisdexamfetamine (VYVANSE) 60 MG capsule Take 1 capsule by mouth every morning every morning - Please fill 30 days after the previous Vyvanse fill 07/10/20  Yes   losartan-hydrochlorothiazide (HYZAAR) 100-25 MG tablet Take one tablet by mouth every morning. 07/10/20  Yes   rosuvastatin (CRESTOR) 10 MG tablet take 1 tablet by mouth once a week 07/24/20  Yes   SitaGLIPtin-MetFORMIN HCl (JANUMET XR) 50-500 MG TB24 Take one tablet by mouth twice daily with meals. 07/10/20  Yes   topiramate (TOPAMAX) 25 MG tablet Take 1 tablet by mouth once at bedtime for 2 weeks then increase to 2 tablets at bedtime 08/16/20  Yes   Butalbital-APAP-Caffeine 50-300-40 MG CAPS TAKE 1 CAPSULE BY MOUTH EVERY 4 HOURS AS NEEDED FOR HEADACHE PAIN  01/30/20 01/29/21  Elias Else, MD  ferrous sulfate (SLOW FE) 160 (50 Fe) MG TBCR SR tablet Take by mouth.    [provider]  hydrochlorothiazide (,MICROZIDE/HYDRODIURIL,) 12.5 MG capsule Take 12.5 mg by mouth daily.   Patient not taking: Reported on 11/05/2020    [provider]  ibuprofen (ADVIL,MOTRIN) 800 MG tablet Take by mouth. 12/24/13   [provider]  lisdexamfetamine (VYVANSE) 60 MG capsule TAKE 1 CAPSULE BY MOUTH EVERY MORNING 01/13/20 07/11/20  Elias Else, MD  lisdexamfetamine (VYVANSE) 60 MG capsule TAKE 1 CAPSULE BY MOUTH EVERY MORNING 01/13/20 07/11/20  Elias Else, MD   lisdexamfetamine (VYVANSE) 60 MG capsule TAKE 1 CAPSULE BY MOUTH EVERY MORNING 01/13/20 07/11/20  Elias Else, MD  lisdexamfetamine (VYVANSE) 60 MG capsule TAKE 1 CAPSULE BY MOUTH EVERY MORNING 12/08/19 06/05/20  Laurann Montana, MD  lisdexamfetamine (VYVANSE) 60 MG capsule Take 1 capsule by mouth every morning every morning - Please fill 30 days after the previous Vyvanse fill 07/10/20     lisdexamfetamine (VYVANSE) 60 MG capsule Take 1 capsule (60 mg total) by mouth every morning. 10/29/20     lisdexamfetamine (VYVANSE) 60 MG capsule Take 1 capsule (60 mg total) by mouth in the morning. 11/28/20     lisdexamfetamine (VYVANSE) 60 MG capsule Take 1 capsule (60 mg total) by mouth in the morning. 12/28/20     losartan (COZAAR) 100 MG tablet Take 100 mg by mouth daily.   Patient not taking: Reported on 11/05/2020    [provider]  losartan-hydrochlorothiazide (HYZAAR) 100-25 MG tablet TAKE 1 TABLET BY MOUTH EVERY MORNING 01/13/20 01/12/21  Elias Else, MD  losartan-hydrochlorothiazide [facility]) 100-25 MG tablet TAKE 1 TABLET BY MOUTH EVERY MORNING 06/29/19 06/28/20  Elias Else, MD  Multiple Vitamin (MULTIVITAMIN) tablet Take by mouth.    [provider]  predniSONE (DELTASONE) 20 MG tablet 2 tabs po daily x 4 days 11/15/20   Melene Plan, DO  propranolol ER (INDERAL LA) 120 MG 24 hr capsule Take 1 capsule by mouth once at bedtime Patient not taking: Reported on 11/05/2020 08/16/20     rosuvastatin (CRESTOR) 10 MG tablet TAKE 1 TABLET BY MOUTH ONCE A WEEK 06/29/19 07/19/20  Elias Else, MD  SitaGLIPtin-MetFORMIN HCl 50-500 MG TB24 TAKE 1 TABLET BY MOUTH TWICE A DAY WITH MEALS Patient not taking: Reported on 11/05/2020 01/13/20 01/12/21  Elias Else, MD  SUMAtriptan (IMITREX) 100 MG tablet TAKE 1 TABLET BY MOUTH IMMEDIATELY AT ONSET OF HEADACHE, MAY REPEAT IN 2 HOURS FOR PERSISTENT HEADACHE PAIN 01/30/20 01/29/21  Elias Else, MD  triamcinolone (KENALOG) 0.1 % APPLY 1 APPLICATION EXTERNALLY  TWO TIMES A WEEK AS NEEDED 01/23/20 01/22/21  Elias Else, MD  VYVANSE 30 MG capsule  04/01/15   [provider]  VYVANSE 60 MG capsule  04/15/15   [provider]    Allergies    Latex  Review of Systems   Review of Systems  Constitutional:  Positive for fever (now resolved). Negative for chills.  HENT:  Negative for congestion and rhinorrhea.   Eyes:  Negative for redness and visual disturbance.  Respiratory:  Positive for cough and shortness of breath. Negative for wheezing.   Cardiovascular:  Negative for chest pain and palpitations.  Gastrointestinal:  Negative for nausea and vomiting.  Genitourinary:  Negative for dysuria and urgency.  Musculoskeletal:  Negative for arthralgias and myalgias.  Skin:  Negative for pallor and wound.  Neurological:  Negative for dizziness and headaches.   Physical Exam Updated  Vital Signs BP (!) 169/95 (BP Location: Right Arm)   Pulse 97   Temp 98.4 F (36.9 C)   Resp 20   Ht 5\' 3"  (1.6 m)   Wt 81.6 kg   SpO2 99%   BMI 31.89 kg/m   Physical Exam Vitals and nursing note reviewed.  Constitutional:      General: She is not in acute distress.    Appearance: She is well-developed. She is not diaphoretic.  HENT:     Head: Normocephalic and atraumatic.  Eyes:     Pupils: Pupils are equal, round, and reactive to light.  Cardiovascular:     Rate and Rhythm: Normal rate and regular rhythm.     Heart sounds: No murmur heard.   No friction rub. No gallop.  Pulmonary:     Effort: Pulmonary effort is normal.     Breath sounds: No wheezing or rales.     Comments: Clear lung sounds.  Bronchospastic cough. Abdominal:     General: There is no distension.     Palpations: Abdomen is soft.     Tenderness: There is no abdominal tenderness.  Musculoskeletal:        General: No tenderness.     Cervical back: Normal range of motion and neck supple.  Skin:    General: Skin is warm and dry.  Neurological:     Mental Status: She  is alert and oriented to person, place, and time.  Psychiatric:        Behavior: Behavior normal.    ED Results / Procedures / Treatments   Labs (all labs ordered are listed, but only abnormal results are displayed) Labs Reviewed - No data to display  EKG None  Radiology DG Chest 2 View  Result Date: 11/15/2020 CLINICAL DATA:  Cough, COVID positive EXAM: CHEST - 2 VIEW COMPARISON:  Chest radiograph 12/17/2007 FINDINGS: The cardiomediastinal silhouette is within normal limits. There is no focal consolidation or pulmonary edema. There is no pleural effusion or pneumothorax. There is no acute osseous abnormality. Surgical clips are noted in the right neck. IMPRESSION: No radiographic evidence of acute cardiopulmonary process. Electronically Signed   By: 12/19/2007 M.D.   On: 11/15/2020 16:49    Procedures Procedures   Medications Ordered in ED Medications  predniSONE (DELTASONE) tablet 40 mg (has no administration in time range)  albuterol (VENTOLIN HFA) 108 (90 Base) MCG/ACT inhaler 2 puff (2 puffs Inhalation Given 11/15/20 1753)  aerochamber plus with mask device 1 each (1 each Other Given 11/15/20 1753)    ED Course  I have reviewed the triage vital signs and the nursing notes.  Pertinent labs & imaging results that were available during my care of the patient were reviewed by me and considered in my medical decision making (see chart for details).    MDM Rules/Calculators/A&P                           55 yo F with a chief complaints of cough and shortness of breath.  Patient has COVID.  Has had symptoms for about a week.  She is well-appearing and nontoxic.  No hypoxia.  Clear lung sounds.  We will do a trial of albuterol.  Mild improvement with albuterol.  Will start on burst of steroids.  PCP follow-up.  6:20 PM:  I have discussed the diagnosis/risks/treatment options with the patient and believe the pt to be eligible for discharge home to follow-up with PCP.  We also  discussed returning to the ED immediately if new or worsening sx occur. We discussed the sx which are most concerning (e.g., sudden worsening sob, fever, inability to tolerate by mouth) that necessitate immediate return. Medications administered to the patient during their visit and any new prescriptions provided to the patient are listed below.  Medications given during this visit Medications  predniSONE (DELTASONE) tablet 40 mg (has no administration in time range)  albuterol (VENTOLIN HFA) 108 (90 Base) MCG/ACT inhaler 2 puff (2 puffs Inhalation Given 11/15/20 1753)  aerochamber plus with mask device 1 each (1 each Other Given 11/15/20 1753)     The patient appears reasonably screen and/or stabilized for discharge and I doubt any other medical condition or other [facility] requiring further screening, evaluation, or treatment in the ED at this time prior to discharge.   Final Clinical Impression(s) / ED Diagnoses Final diagnoses:  COVID-19 virus infection    Rx / DC Orders ED Discharge Orders          Ordered    predniSONE (DELTASONE) 20 MG tablet  Status:  Discontinued        11/15/20 1819    predniSONE (DELTASONE) 20 MG tablet        11/15/20 1819             Melene Plan, DO 11/15/20 1820

## 2020-11-15 NOTE — ED Notes (Signed)
Patient educated on the use of MDI and spacer. Patient acknowledged understanding and demonstrated good effort.

## 2020-11-18 ENCOUNTER — Other Ambulatory Visit (HOSPITAL_COMMUNITY): Payer: Self-pay

## 2020-12-02 ENCOUNTER — Other Ambulatory Visit (HOSPITAL_COMMUNITY): Payer: Self-pay

## 2020-12-02 DIAGNOSIS — M545 Low back pain, unspecified: Secondary | ICD-10-CM | POA: Diagnosis not present

## 2020-12-02 MED ORDER — CYCLOBENZAPRINE HCL 5 MG PO TABS
5.0000 mg | ORAL_TABLET | Freq: Three times a day (TID) | ORAL | 0 refills | Status: DC | PRN
  Filled 2020-12-02: qty 21, 7d supply, fill #0

## 2020-12-02 MED ORDER — PREDNISONE 20 MG PO TABS
20.0000 mg | ORAL_TABLET | ORAL | 0 refills | Status: DC
  Filled 2020-12-02: qty 15, 10d supply, fill #0

## 2020-12-23 ENCOUNTER — Other Ambulatory Visit (HOSPITAL_COMMUNITY): Payer: Self-pay

## 2021-01-16 DIAGNOSIS — E1165 Type 2 diabetes mellitus with hyperglycemia: Secondary | ICD-10-CM | POA: Diagnosis not present

## 2021-01-16 DIAGNOSIS — E7800 Pure hypercholesterolemia, unspecified: Secondary | ICD-10-CM | POA: Diagnosis not present

## 2021-01-16 DIAGNOSIS — Z0000 Encounter for general adult medical examination without abnormal findings: Secondary | ICD-10-CM | POA: Diagnosis not present

## 2021-01-27 ENCOUNTER — Other Ambulatory Visit (HOSPITAL_COMMUNITY): Payer: Self-pay

## 2021-02-14 ENCOUNTER — Other Ambulatory Visit (HOSPITAL_COMMUNITY): Payer: Self-pay

## 2021-02-14 MED ORDER — IBUPROFEN 600 MG PO TABS
600.0000 mg | ORAL_TABLET | ORAL | 0 refills | Status: DC
  Filled 2021-02-14: qty 14, 3d supply, fill #0

## 2021-02-14 MED ORDER — AMOXICILLIN 500 MG PO CAPS
500.0000 mg | ORAL_CAPSULE | Freq: Four times a day (QID) | ORAL | 0 refills | Status: AC
  Filled 2021-02-14: qty 28, 7d supply, fill #0

## 2021-03-13 ENCOUNTER — Other Ambulatory Visit (HOSPITAL_COMMUNITY): Payer: Self-pay

## 2021-03-14 ENCOUNTER — Other Ambulatory Visit (HOSPITAL_COMMUNITY): Payer: Self-pay

## 2021-03-14 MED ORDER — VYVANSE 60 MG PO CAPS
60.0000 mg | ORAL_CAPSULE | Freq: Every morning | ORAL | 0 refills | Status: DC
  Filled 2021-03-14 – 2021-03-24 (×2): qty 30, 30d supply, fill #0

## 2021-03-21 ENCOUNTER — Other Ambulatory Visit (HOSPITAL_COMMUNITY): Payer: Self-pay

## 2021-03-24 ENCOUNTER — Telehealth: Payer: Self-pay | Admitting: Neurology

## 2021-03-24 ENCOUNTER — Other Ambulatory Visit (HOSPITAL_COMMUNITY): Payer: Self-pay

## 2021-03-24 MED ORDER — LOSARTAN POTASSIUM-HCTZ 100-25 MG PO TABS
1.0000 | ORAL_TABLET | Freq: Every morning | ORAL | 0 refills | Status: DC
  Filled 2021-03-24: qty 90, 90d supply, fill #0

## 2021-03-24 MED ORDER — NURTEC 75 MG PO TBDP
75.0000 mg | ORAL_TABLET | ORAL | 0 refills | Status: DC | PRN
  Filled 2021-03-24: qty 8, 30d supply, fill #0
  Filled 2021-04-18: qty 16, 16d supply, fill #0
  Filled 2021-04-30 – 2021-05-14 (×2): qty 16, 30d supply, fill #0

## 2021-03-24 NOTE — Telephone Encounter (Signed)
Patient had samples of Nurtec and it worked well. She'd like a prescription sent in please.  Patient has a headache at a 10 today and also has lost two family members in the last week.  She is traveling home tomorrow.   If prescription needs a PA, she is requesting another sample, if possible.

## 2021-03-24 NOTE — Telephone Encounter (Signed)
Patient advised script sent to the [facility] outpatient pharmacy.   Okay if she needs a sample please come by the office.

## 2021-03-25 ENCOUNTER — Other Ambulatory Visit (HOSPITAL_COMMUNITY): Payer: Self-pay

## 2021-03-25 NOTE — Telephone Encounter (Signed)
Medication Samples have been provided to the patient.  Drug name: Nurtec       Strength: 75 mg        Qty: 4  LOT: [id number]  Exp.Date: 07/2023  Dosing instructions: as needed  The patient has been instructed regarding the correct time, dose, and frequency of taking this medication, including desired effects and most common side effects.   Leida Lauth 3:27 PM 03/25/2021

## 2021-03-26 ENCOUNTER — Telehealth: Payer: Self-pay

## 2021-03-26 NOTE — Telephone Encounter (Signed)
New message   Your information has been sent to [employer].  Areta Haber (Key: [id number]) Nurtec 75MG  dispersible tablets   Form [employer] ePA Form 2017 NCPDP Created 13 minutes ago Sent to Plan 9 minutes ago Plan Response 9 minutes ago Submit Clinical Questions 1 minute ago Determination Wait for Determination Please wait for [employer] 2017 to return a determination.

## 2021-03-26 NOTE — Telephone Encounter (Signed)
Tara House (Key: [id number]) Nurtec 75MG  dispersible tablets   Form [employer] ePA Form 2017 NCPDP Created 13 minutes ago Sent to Plan 9 minutes ago Plan Response 9 minutes ago Submit Clinical Questions 1 minute ago Determination Wait for Determination Please wait for [employer] 2017 to return a determination.

## 2021-03-27 NOTE — Telephone Encounter (Signed)
F/u  Areta Haber (Key: [id number]) Nurtec 75MG  dispersible tablets   Form [employer] ePA Form 2017 NCPDP Created 1 day ago Sent to Plan 1 day ago Plan Response 1 day ago Submit Clinical Questions 1 day ago Determination Favorable 1 hour ago Message from Plan The request has been approved. The authorization is effective for a maximum of 6 fills from 03/27/2021 to 09/23/2021, as long as the member is enrolled in their current health plan. The request was approved as submitted. This request has been approved for 18 tablets per 30 days. A written notification letter will follow with additional details.

## 2021-04-09 DIAGNOSIS — Z0279 Encounter for issue of other medical certificate: Secondary | ICD-10-CM

## 2021-04-18 ENCOUNTER — Other Ambulatory Visit (HOSPITAL_COMMUNITY): Payer: Self-pay

## 2021-04-18 DIAGNOSIS — I10 Essential (primary) hypertension: Secondary | ICD-10-CM | POA: Diagnosis not present

## 2021-04-18 DIAGNOSIS — Z124 Encounter for screening for malignant neoplasm of cervix: Secondary | ICD-10-CM | POA: Diagnosis not present

## 2021-04-18 DIAGNOSIS — Z0000 Encounter for general adult medical examination without abnormal findings: Secondary | ICD-10-CM | POA: Diagnosis not present

## 2021-04-18 DIAGNOSIS — E1169 Type 2 diabetes mellitus with other specified complication: Secondary | ICD-10-CM | POA: Diagnosis not present

## 2021-04-18 DIAGNOSIS — G43109 Migraine with aura, not intractable, without status migrainosus: Secondary | ICD-10-CM | POA: Diagnosis not present

## 2021-04-18 DIAGNOSIS — R87610 Atypical squamous cells of undetermined significance on cytologic smear of cervix (ASC-US): Secondary | ICD-10-CM | POA: Diagnosis not present

## 2021-04-18 DIAGNOSIS — Z113 Encounter for screening for infections with a predominantly sexual mode of transmission: Secondary | ICD-10-CM | POA: Diagnosis not present

## 2021-04-18 DIAGNOSIS — Z1159 Encounter for screening for other viral diseases: Secondary | ICD-10-CM | POA: Diagnosis not present

## 2021-04-18 DIAGNOSIS — E559 Vitamin D deficiency, unspecified: Secondary | ICD-10-CM | POA: Diagnosis not present

## 2021-04-18 DIAGNOSIS — E7800 Pure hypercholesterolemia, unspecified: Secondary | ICD-10-CM | POA: Diagnosis not present

## 2021-04-18 DIAGNOSIS — F900 Attention-deficit hyperactivity disorder, predominantly inattentive type: Secondary | ICD-10-CM | POA: Diagnosis not present

## 2021-04-18 MED ORDER — DOXYCYCLINE HYCLATE 100 MG PO CAPS
100.0000 mg | ORAL_CAPSULE | Freq: Two times a day (BID) | ORAL | 0 refills | Status: DC
  Filled 2021-04-18: qty 14, 7d supply, fill #0

## 2021-04-18 MED ORDER — TOPIRAMATE 50 MG PO TABS
50.0000 mg | ORAL_TABLET | Freq: Every day | ORAL | 1 refills | Status: DC
  Filled 2021-04-18: qty 90, 90d supply, fill #0

## 2021-04-18 MED ORDER — ROSUVASTATIN CALCIUM 10 MG PO TABS
10.0000 mg | ORAL_TABLET | ORAL | 0 refills | Status: DC
  Filled 2021-04-18 – 2021-05-14 (×2): qty 24, 84d supply, fill #0

## 2021-04-25 ENCOUNTER — Other Ambulatory Visit (HOSPITAL_COMMUNITY): Payer: Self-pay

## 2021-04-25 DIAGNOSIS — J018 Other acute sinusitis: Secondary | ICD-10-CM | POA: Diagnosis not present

## 2021-04-25 DIAGNOSIS — H6983 Other specified disorders of Eustachian tube, bilateral: Secondary | ICD-10-CM | POA: Diagnosis not present

## 2021-04-25 MED ORDER — AMOXICILLIN-POT CLAVULANATE 875-125 MG PO TABS
1.0000 | ORAL_TABLET | Freq: Two times a day (BID) | ORAL | 0 refills | Status: DC
  Filled 2021-04-25: qty 20, 10d supply, fill #0

## 2021-04-25 MED ORDER — FLUTICASONE PROPIONATE 50 MCG/ACT NA SUSP
1.0000 | Freq: Every day | NASAL | 0 refills | Status: DC
  Filled 2021-04-25: qty 16, 30d supply, fill #0

## 2021-04-30 ENCOUNTER — Other Ambulatory Visit (HOSPITAL_COMMUNITY): Payer: Self-pay

## 2021-05-08 ENCOUNTER — Other Ambulatory Visit (HOSPITAL_COMMUNITY): Payer: Self-pay

## 2021-05-14 ENCOUNTER — Other Ambulatory Visit (HOSPITAL_COMMUNITY): Payer: Self-pay

## 2021-05-14 NOTE — Progress Notes (Signed)
? ?NEUROLOGY FOLLOW UP OFFICE NOTE ? ?Tara House ?[id number] ? ?Assessment/Plan:  ? ?Probable migraine without aura, without status migrainosus, not intractable.  The facial swelling may be an autonomic symptom that can accompany migraine.  Trigeminal neuralgia less likely given her symptoms.  Not consistent with TMJ dysfunction.  As headaches significantly improved, would defer checking labs (sed rate, CRP) for temporal arteritis. ?Elevated blood pressure ?  ?Migraine prevention:  Topiramate 50mg  at bedtime ?Migraine rescue:  She would like to try something less sedating than a triptan.  Will have her try Nurtec ?Limit use of pain relievers to no more than 2 days out of week to prevent risk of rebound or medication-overuse headache.   ?Keep headache diary ?Follow up with PCP regarding blood pressure. ?Follow up 6 months. ?  ?  ?  ?Subjective:  ?Tara House is a 56 year old right-handed female with HTN and depression who follows up for migraines. ? ?UPDATE: ?Intensity:  usually mild (rarely severe) ?Duration:  Within 2 hours notes significant improvement with Nurtec ?Frequency:  1 to 2 a week ? ? ?Rescue protocol:  Usually Nurtec; sometimes may still take sumatriptan ?Current NSAIDS/analgesics:  ibuprofen ?Current triptans:  sumatriptan 100mg  ?Current ergotamine:  none ?Current anti-emetic:  none ?Current muscle relaxants:  Flexeril ?Current Antihypertensive medications:  HCTZ ?Current Antidepressant medications:  Vyvanse ?Current Anticonvulsant medications:  topiramate 50mg  QHS  ?Current anti-CGRP:  Nurtec (rescue) ?Current Vitamins/Herbal/Supplements:  turmeric ?Current Antihistamines/Decongestants:  none ?Other therapy:  none ?Hormone/birth control:  none ? ?Caffeine:  One 21 oz cup of coffee daily ?Depression:  no; Anxiety:  no ?Other pain:  no ?Sleep hygiene:  usually okay ? ?HISTORY:  ?After receiving the second COVID vaccine [employer]) on 11/13/2020, she developed fever and swollen lymph  nodes under her left arm.  She was out of work for 2 weeks.  She started having frequent headaches soon afterwards.  She has history of migraines that were infrequent and associated with menses.  Headaches following the vaccine are different.  They are severe left sided posterior pressure pain with left sided facial swelling with associated dull throbbing as well as photophobia and phonophobia but no nausea, vomiting, visual disturbance, facial numbness/tingling, or rash.  Initially they were daily and persistent.  Now they last until she sleeps it off after 3-4 hours.  They occur 1-2 days every 2-3 days.  No preceding ear infection, TMJ or tooth abscess. ?  ?MRI of brain with and without contrast and MRA of head on 09/08/2020 were unremarkable. ?  ? ?  ?Past NSAIDS/analgesics:  Tylenol, Midol, Fioricet, Excedrin Migraine ?Past abortive triptans:  none ?Past abortive ergotamine:  none ?Past muscle relaxants:  none ?Past anti-emetic:  none ?Past antihypertensive medications:  propranolol, losartan ?Past antidepressant medications:  none ?Past anticonvulsant medications:  none ?Past anti-CGRP:  none ?Past vitamins/Herbal/Supplements:  none ?Past antihistamines/decongestants:  none ?Other past therapies:  none ?  ? ?Family history of headache:  No ? ?PAST MEDICAL HISTORY: ?Past Medical History:  ?Diagnosis Date  ? Diabetes mellitus without complication (HCC)   ? Hypertension   ? ? ?MEDICATIONS: ?Current Outpatient Medications on File Prior to Visit  ?Medication Sig Dispense Refill  ? amoxicillin-clavulanate (AUGMENTIN) 875-125 MG tablet Take 1 tablet by mouth every 12 (twelve) hours for 10 days. 20 tablet 0  ? cyclobenzaprine (FLEXERIL) 5 MG tablet Take 1 tablet (5 mg total) by mouth 3 (three) times daily as needed for 7 days. 21 tablet 0  ? doxycycline (VIBRAMYCIN)  100 MG capsule Take 1 capsule (100 mg total) by mouth 2 (two) times daily for 7 days. 14 capsule 0  ? ferrous sulfate (SLOW FE) 160 (50 Fe) MG TBCR SR  tablet Take by mouth.    ? fluticasone (FLONASE) 50 MCG/ACT nasal spray Place 1 spray into both nostrils daily for 10 days 16 g 0  ? hydrochlorothiazide (,MICROZIDE/HYDRODIURIL,) 12.5 MG capsule Take 12.5 mg by mouth daily.   (Patient not taking: Reported on 11/05/2020)    ? HYDROcodone bit-homatropine (HYCODAN) 5-1.5 MG/5ML syrup Take 5 mLs by mouth every 6 (six) hours as needed. 100 mL 0  ? ibuprofen (ADVIL) 600 MG tablet Take 1 tablet (600 mg total) by mouth every 4-6 hours 14 tablet 0  ? ibuprofen (ADVIL,MOTRIN) 800 MG tablet Take by mouth.    ? lisdexamfetamine (VYVANSE) 60 MG capsule TAKE 1 CAPSULE BY MOUTH EVERY MORNING 30 capsule 0  ? lisdexamfetamine (VYVANSE) 60 MG capsule TAKE 1 CAPSULE BY MOUTH EVERY MORNING 30 capsule 0  ? lisdexamfetamine (VYVANSE) 60 MG capsule TAKE 1 CAPSULE BY MOUTH EVERY MORNING 30 capsule 0  ? lisdexamfetamine (VYVANSE) 60 MG capsule TAKE 1 CAPSULE BY MOUTH EVERY MORNING 30 capsule 0  ? lisdexamfetamine (VYVANSE) 60 MG capsule TAKE 1 CAPSULE BY MOUTH EVERY MORNING **06/18/20** 30 capsule 0  ? lisdexamfetamine (VYVANSE) 60 MG capsule Take 1 capsule by mouth daily 30 capsule 0  ? lisdexamfetamine (VYVANSE) 60 MG capsule Take 1 capsule by mouth every morning every morning - Please fill 30 days after the previous Vyvanse fill 30 capsule 0  ? lisdexamfetamine (VYVANSE) 60 MG capsule Take 1 capsule (60 mg total) by mouth every morning. 30 capsule 0  ? lisdexamfetamine (VYVANSE) 60 MG capsule Take 1 capsule (60 mg total) by mouth in the morning. 30 capsule 0  ? lisdexamfetamine (VYVANSE) 60 MG capsule Take 1 capsule (60 mg total) by mouth every morning. 30 capsule 0  ? losartan (COZAAR) 100 MG tablet Take 100 mg by mouth daily.   (Patient not taking: Reported on 11/05/2020)    ? losartan-hydrochlorothiazide (HYZAAR) 100-25 MG tablet TAKE 1 TABLET BY MOUTH EVERY MORNING 90 tablet 1  ? losartan-hydrochlorothiazide (HYZAAR) 100-25 MG tablet Take 1 tablet by mouth every morning. 90 tablet 0  ?  Multiple Vitamin (MULTIVITAMIN) tablet Take by mouth.    ? predniSONE (DELTASONE) 20 MG tablet Take 2 tablets by mouth in the morning for 5 days, then take 1 tablet in the morning for 5 days. 15 tablet 0  ? propranolol ER (INDERAL LA) 120 MG 24 hr capsule Take 1 capsule by mouth once at bedtime (Patient not taking: Reported on 11/05/2020) 90 capsule 0  ? Rimegepant Sulfate (NURTEC) 75 MG TBDP Take 1 tablet by mouth as needed (take 1 tablet at the earlist onset of a mirgraine. max 1 tablet in 24 hours). 16 tablet 0  ? rosuvastatin (CRESTOR) 10 MG tablet TAKE 1 TABLET BY MOUTH ONCE A WEEK 13 tablet 3  ? rosuvastatin (CRESTOR) 10 MG tablet take 1 tablet by mouth once a week 13 tablet 2  ? rosuvastatin (CRESTOR) 10 MG tablet Take 1 tablet (10 mg total) by mouth 2 (two) times a week. 24 tablet 0  ? SitaGLIPtin-MetFORMIN HCl (JANUMET XR) 50-500 MG TB24 Take one tablet by mouth twice daily with meals. 180 tablet 1  ? SitaGLIPtin-MetFORMIN HCl 50-500 MG TB24 TAKE 1 TABLET BY MOUTH TWICE A DAY WITH MEALS (Patient not taking: Reported on 11/05/2020) 180 tablet 1  ?  SUMAtriptan (IMITREX) 100 MG tablet TAKE 1 TABLET BY MOUTH IMMEDIATELY AT ONSET OF HEADACHE, MAY REPEAT IN 2 HOURS FOR PERSISTENT HEADACHE PAIN 9 tablet 3  ? topiramate (TOPAMAX) 25 MG tablet Take 1 tablet by mouth once at bedtime for 2 weeks then increase to 2 tablets at bedtime 42 tablet 0  ? topiramate (TOPAMAX) 50 MG tablet Take 1 tablet (50 mg total) by mouth at bedtime. 90 tablet 1  ? VYVANSE 30 MG capsule   0  ? VYVANSE 60 MG capsule     ? ?No current facility-administered medications on file prior to visit.  ? ? ?ALLERGIES: ?Allergies  ?Allergen Reactions  ? Latex   ? ? ?FAMILY HISTORY: ?No family history on file. ? ?  ?Objective:  ?Blood pressure (!) 151/88, pulse 87, height 5\' 3"  (1.6 m), weight 178 lb (80.7 kg), SpO2 100 %. ?General: No acute distress.  Patient appears well-groomed.   ? ? ? , DO ? ?CC: Shon Millet, MD ? ? ? ? ? ? ?

## 2021-05-15 ENCOUNTER — Other Ambulatory Visit: Payer: Self-pay

## 2021-05-15 ENCOUNTER — Other Ambulatory Visit (HOSPITAL_COMMUNITY): Payer: Self-pay

## 2021-05-15 ENCOUNTER — Ambulatory Visit: Payer: 59 | Admitting: Neurology

## 2021-05-15 ENCOUNTER — Encounter: Payer: Self-pay | Admitting: Neurology

## 2021-05-15 VITALS — BP 151/88 | HR 87 | Ht 63.0 in | Wt 178.0 lb

## 2021-05-15 DIAGNOSIS — G43009 Migraine without aura, not intractable, without status migrainosus: Secondary | ICD-10-CM | POA: Diagnosis not present

## 2021-05-15 DIAGNOSIS — R030 Elevated blood-pressure reading, without diagnosis of hypertension: Secondary | ICD-10-CM | POA: Diagnosis not present

## 2021-05-15 NOTE — Patient Instructions (Signed)
Continue topiramate 50mg  at bedtime ?Nurtec as needed.  May use sumatriptan if necessary ?Limit use of pain relievers to no more than 2 days out of week to prevent risk of rebound or medication-overuse headache. ?Keep headache diary ?

## 2021-05-16 ENCOUNTER — Other Ambulatory Visit (HOSPITAL_COMMUNITY): Payer: Self-pay

## 2021-05-16 MED ORDER — VYVANSE 60 MG PO CAPS
60.0000 mg | ORAL_CAPSULE | Freq: Every morning | ORAL | 0 refills | Status: DC
  Filled 2021-05-16 – 2021-05-28 (×2): qty 30, 30d supply, fill #0

## 2021-05-26 ENCOUNTER — Other Ambulatory Visit (HOSPITAL_COMMUNITY): Payer: Self-pay

## 2021-05-28 ENCOUNTER — Other Ambulatory Visit (HOSPITAL_COMMUNITY): Payer: Self-pay

## 2021-06-04 ENCOUNTER — Other Ambulatory Visit (HOSPITAL_COMMUNITY): Payer: Self-pay

## 2021-06-18 ENCOUNTER — Other Ambulatory Visit (HOSPITAL_COMMUNITY): Payer: Self-pay

## 2021-06-25 ENCOUNTER — Other Ambulatory Visit (HOSPITAL_COMMUNITY): Payer: Self-pay

## 2021-06-26 ENCOUNTER — Other Ambulatory Visit (HOSPITAL_COMMUNITY): Payer: Self-pay

## 2021-07-04 ENCOUNTER — Other Ambulatory Visit (HOSPITAL_COMMUNITY): Payer: Self-pay

## 2021-07-04 MED ORDER — LOSARTAN POTASSIUM-HCTZ 100-25 MG PO TABS
1.0000 | ORAL_TABLET | Freq: Every morning | ORAL | 0 refills | Status: DC
  Filled 2021-07-04: qty 90, 90d supply, fill #0

## 2021-07-04 MED ORDER — VYVANSE 60 MG PO CAPS
60.0000 mg | ORAL_CAPSULE | ORAL | 0 refills | Status: DC
  Filled 2021-07-04: qty 30, 30d supply, fill #0

## 2021-07-14 ENCOUNTER — Encounter: Payer: Self-pay | Admitting: Neurology

## 2021-07-23 ENCOUNTER — Ambulatory Visit (INDEPENDENT_AMBULATORY_CARE_PROVIDER_SITE_OTHER): Payer: 59

## 2021-07-23 ENCOUNTER — Ambulatory Visit: Payer: 59 | Admitting: Podiatry

## 2021-07-23 ENCOUNTER — Encounter: Payer: Self-pay | Admitting: Podiatry

## 2021-07-23 ENCOUNTER — Other Ambulatory Visit (HOSPITAL_COMMUNITY): Payer: Self-pay

## 2021-07-23 DIAGNOSIS — M205X2 Other deformities of toe(s) (acquired), left foot: Secondary | ICD-10-CM

## 2021-07-23 MED ORDER — MELOXICAM 15 MG PO TABS
15.0000 mg | ORAL_TABLET | Freq: Every day | ORAL | 0 refills | Status: DC
  Filled 2021-07-23: qty 30, 30d supply, fill #0

## 2021-07-23 NOTE — Progress Notes (Signed)
  Subjective:  Patient ID: Tara House, female    DOB: 1965-11-22,   MRN: [id number]  No chief complaint on file.   56 y.o. female presents for concern of left foot bunion pain that has been going on for a couple months. Relates that certain shoes will bother the area and relates most of the time it hurts after being on it for long periods of time. She is on her feet all day and generally wears good shoes . Denies any other pedal complaints. Denies n/v/f/c.   Past Medical History:  Diagnosis Date   Diabetes mellitus without complication ([place])    Hypertension     Objective:  Physical Exam: Vascular: DP/PT pulses 2/4 bilateral. CFT <3 seconds. Normal hair growth on digits. No edema.  Skin. No lacerations or abrasions bilateral feet.  Musculoskeletal: MMT 5/5 bilateral lower extremities in DF, PF, Inversion and Eversion. Deceased ROM in DF of ankle joint. Minimally tender to palpation around the first MPJ. Pain with ROM of the joint particularly on end ROM.  Neurological: Sensation intact to light touch.   Assessment:   1. Hallux limitus of left foot      Plan:  Patient was evaluated and treated and all questions answered. -Xrays reviewed. Left foot with decreased joint spaced noted to first MPJ and some cystic and osteophytic changes, small increased in HAV angle.  -Discussed hallux limitus and  treatement options; conservative and  Surgical management; risks, benefits, alternatives discussed. All patient's questions answered. -Rx Meloxicam provided. -Recommend continue with good supportive shoes and inserts. Discussed stiff soled shoes and use of carbon fiber foot plate.   -Patient to return to office as needed or sooner if condition worsens.   Lorenda Peck, DPM

## 2021-07-30 ENCOUNTER — Other Ambulatory Visit (HOSPITAL_COMMUNITY): Payer: Self-pay

## 2021-07-30 DIAGNOSIS — E1169 Type 2 diabetes mellitus with other specified complication: Secondary | ICD-10-CM | POA: Diagnosis not present

## 2021-07-30 DIAGNOSIS — F900 Attention-deficit hyperactivity disorder, predominantly inattentive type: Secondary | ICD-10-CM | POA: Diagnosis not present

## 2021-07-30 DIAGNOSIS — I10 Essential (primary) hypertension: Secondary | ICD-10-CM | POA: Diagnosis not present

## 2021-07-30 DIAGNOSIS — E7800 Pure hypercholesterolemia, unspecified: Secondary | ICD-10-CM | POA: Diagnosis not present

## 2021-07-30 MED ORDER — VYVANSE 60 MG PO CAPS
60.0000 mg | ORAL_CAPSULE | Freq: Every morning | ORAL | 0 refills | Status: DC
  Filled 2021-07-30 – 2021-08-12 (×2): qty 30, 30d supply, fill #0

## 2021-07-30 MED ORDER — FREESTYLE LANCETS MISC
1.0000 | Freq: Every day | 3 refills | Status: AC
  Filled 2021-07-30 – 2021-08-29 (×2): qty 100, 90d supply, fill #0

## 2021-07-30 MED ORDER — FREESTYLE LITE TEST VI STRP
1.0000 | ORAL_STRIP | Freq: Every day | 3 refills | Status: AC
  Filled 2021-07-30 – 2021-08-29 (×2): qty 50, 50d supply, fill #0

## 2021-07-30 MED ORDER — FREESTYLE LITE W/DEVICE KIT
PACK | 0 refills | Status: AC
  Filled 2021-07-30 – 2021-08-29 (×2): qty 1, 1d supply, fill #0

## 2021-08-06 ENCOUNTER — Other Ambulatory Visit (HOSPITAL_COMMUNITY): Payer: Self-pay

## 2021-08-07 ENCOUNTER — Other Ambulatory Visit (HOSPITAL_COMMUNITY): Payer: Self-pay

## 2021-08-12 ENCOUNTER — Other Ambulatory Visit (HOSPITAL_COMMUNITY): Payer: Self-pay

## 2021-08-12 MED ORDER — JANUMET XR 50-500 MG PO TB24
1.0000 | ORAL_TABLET | Freq: Two times a day (BID) | ORAL | 0 refills | Status: DC
  Filled 2021-08-12: qty 60, 30d supply, fill #0
  Filled 2021-09-23: qty 60, 30d supply, fill #1
  Filled 2021-11-05: qty 60, 30d supply, fill #2

## 2021-08-12 MED ORDER — ROSUVASTATIN CALCIUM 10 MG PO TABS
10.0000 mg | ORAL_TABLET | ORAL | 0 refills | Status: DC
  Filled 2021-08-12: qty 24, 84d supply, fill #0

## 2021-08-29 ENCOUNTER — Other Ambulatory Visit (HOSPITAL_COMMUNITY): Payer: Self-pay

## 2021-09-12 ENCOUNTER — Telehealth (HOSPITAL_COMMUNITY): Payer: Self-pay | Admitting: Pharmacy Technician

## 2021-09-12 ENCOUNTER — Other Ambulatory Visit (HOSPITAL_COMMUNITY): Payer: Self-pay

## 2021-09-12 NOTE — Telephone Encounter (Signed)
Patient Advocate Encounter   Received notification that prior authorization for Nurtec 75MG  dispersible tablets is required.   PA submitted on 09/12/2021 Key [id number] Status is pending       09/14/2021, CPhT Pharmacy Patient Advocate Specialist [facility] Patient Advocate Team Direct Number: [phone]  Fax: [phone]

## 2021-09-12 NOTE — Telephone Encounter (Signed)
Patient Advocate Encounter  Prior Authorization for Nurtec 75MG  dispersible tablets  has been approved.    PA# Effective dates: 09/12/2021 through 09/12/2022      09/14/2022, CPhT Pharmacy Patient Advocate Specialist [facility] Patient Advocate Team Direct Number: [phone]  Fax: [phone]

## 2021-09-23 ENCOUNTER — Other Ambulatory Visit (HOSPITAL_COMMUNITY): Payer: Self-pay

## 2021-09-24 ENCOUNTER — Other Ambulatory Visit (HOSPITAL_COMMUNITY): Payer: Self-pay

## 2021-09-24 MED ORDER — VYVANSE 60 MG PO CAPS
60.0000 mg | ORAL_CAPSULE | Freq: Every morning | ORAL | 0 refills | Status: DC
  Filled 2021-09-24: qty 30, 30d supply, fill #0

## 2021-10-06 ENCOUNTER — Other Ambulatory Visit (HOSPITAL_COMMUNITY): Payer: Self-pay

## 2021-10-08 ENCOUNTER — Other Ambulatory Visit (HOSPITAL_COMMUNITY): Payer: Self-pay

## 2021-10-08 MED ORDER — LOSARTAN POTASSIUM-HCTZ 100-25 MG PO TABS
1.0000 | ORAL_TABLET | Freq: Every morning | ORAL | 0 refills | Status: DC
  Filled 2021-10-08: qty 90, 90d supply, fill #0

## 2021-10-29 ENCOUNTER — Other Ambulatory Visit (HOSPITAL_COMMUNITY): Payer: Self-pay

## 2021-10-29 DIAGNOSIS — I10 Essential (primary) hypertension: Secondary | ICD-10-CM | POA: Diagnosis not present

## 2021-10-29 DIAGNOSIS — E1169 Type 2 diabetes mellitus with other specified complication: Secondary | ICD-10-CM | POA: Diagnosis not present

## 2021-10-29 DIAGNOSIS — E7800 Pure hypercholesterolemia, unspecified: Secondary | ICD-10-CM | POA: Diagnosis not present

## 2021-10-29 DIAGNOSIS — L309 Dermatitis, unspecified: Secondary | ICD-10-CM | POA: Diagnosis not present

## 2021-10-29 DIAGNOSIS — F900 Attention-deficit hyperactivity disorder, predominantly inattentive type: Secondary | ICD-10-CM | POA: Diagnosis not present

## 2021-10-29 MED ORDER — SPIRONOLACTONE 25 MG PO TABS
25.0000 mg | ORAL_TABLET | Freq: Every day | ORAL | 0 refills | Status: DC
  Filled 2021-10-29: qty 90, 90d supply, fill #0

## 2021-10-29 MED ORDER — VYVANSE 60 MG PO CAPS
60.0000 mg | ORAL_CAPSULE | Freq: Every morning | ORAL | 0 refills | Status: DC
  Filled 2021-10-29: qty 30, 30d supply, fill #0

## 2021-10-29 MED ORDER — CLOTRIMAZOLE-BETAMETHASONE 1-0.05 % EX CREA
TOPICAL_CREAM | Freq: Two times a day (BID) | CUTANEOUS | 1 refills | Status: DC
  Filled 2021-10-29: qty 30, 15d supply, fill #0

## 2021-10-31 ENCOUNTER — Other Ambulatory Visit (HOSPITAL_COMMUNITY): Payer: Self-pay

## 2021-10-31 MED ORDER — ROSUVASTATIN CALCIUM 10 MG PO TABS
10.0000 mg | ORAL_TABLET | ORAL | 3 refills | Status: DC
  Filled 2021-10-31: qty 24, 84d supply, fill #0
  Filled 2022-01-26: qty 24, 84d supply, fill #1
  Filled 2022-04-29: qty 24, 84d supply, fill #2
  Filled 2022-08-06: qty 24, 84d supply, fill #3

## 2021-11-05 ENCOUNTER — Other Ambulatory Visit (HOSPITAL_COMMUNITY): Payer: Self-pay

## 2021-11-17 NOTE — Progress Notes (Deleted)
NEUROLOGY FOLLOW UP OFFICE NOTE  Rebecka Oelkers [id number]  Assessment/Plan:   Probable migraine without aura, without status migrainosus, not intractable.  The facial swelling may be an autonomic symptom that can accompany migraine.  Trigeminal neuralgia less likely given her symptoms.  Not consistent with TMJ dysfunction.  As headaches significantly improved, would defer checking labs (sed rate, CRP) for temporal arteritis. Elevated blood pressure   Migraine prevention:  Topiramate $RemoveBef'50mg'eKygcCPtqE$  at bedtime Migraine rescue:  *** Limit use of pain relievers to no more than 2 days out of week to prevent risk of rebound or medication-overuse headache.   Keep headache diary Follow up with PCP regarding blood pressure. Follow up 6 months.       Subjective:  Yoneko Talerico is a 56 year old right-handed female with HTN and depression who follows up for migraines.   UPDATE: Increased topiramate to $RemoveBefor'75mg'AoVztbpCQZOp$ .  *** Intensity:  usually mild (rarely severe) Duration:  Within 2 hours notes significant improvement with Nurtec Frequency:  1 to 2 a week     Rescue protocol:  Usually Nurtec; sometimes may still take sumatriptan Current NSAIDS/analgesics:  ibuprofen Current triptans:  sumatriptan $RemoveBefo'100mg'zNWaCSBNwnL$  Current ergotamine:  none Current anti-emetic:  none Current muscle relaxants:  Flexeril Current Antihypertensive medications:  HCTZ Current Antidepressant medications:  Vyvanse Current Anticonvulsant medications:  topiramate $RemoveBef'75mg'ddXHxlfBSL$  QHS  Current anti-CGRP:  Nurtec (rescue) Current Vitamins/Herbal/Supplements:  turmeric Current Antihistamines/Decongestants:  none Other therapy:  none Hormone/birth control:  none   Caffeine:  One 21 oz cup of coffee daily Depression:  no; Anxiety:  no Other pain:  no Sleep hygiene:  usually okay   HISTORY:  After receiving the second COVID vaccine [employer]) on 11/13/2020, she developed fever and swollen lymph nodes under her left arm.  She was out of  work for 2 weeks.  She started having frequent headaches soon afterwards.  She has history of migraines that were infrequent and associated with menses.  Headaches following the vaccine are different.  They are severe left sided posterior pressure pain with left sided facial swelling with associated dull throbbing as well as photophobia and phonophobia but no nausea, vomiting, visual disturbance, facial numbness/tingling, or rash.  Initially they were daily and persistent.  Now they last until she sleeps it off after 3-4 hours.  They occur 1-2 days every 2-3 days.  No preceding ear infection, TMJ or tooth abscess.   MRI of brain with and without contrast and MRA of head on 09/08/2020 were unremarkable.       Past NSAIDS/analgesics:  Tylenol, Midol, Fioricet, Excedrin Migraine Past abortive triptans:  none Past abortive ergotamine:  none Past muscle relaxants:  none Past anti-emetic:  none Past antihypertensive medications:  propranolol, losartan Past antidepressant medications:  none Past anticonvulsant medications:  none Past anti-CGRP:  none Past vitamins/Herbal/Supplements:  none Past antihistamines/decongestants:  none Other past therapies:  none     Family history of headache:  No    PAST MEDICAL HISTORY: Past Medical History:  Diagnosis Date   Diabetes mellitus without complication ([place])    Hypertension     MEDICATIONS: Current Outpatient Medications on File Prior to Visit  Medication Sig Dispense Refill   amoxicillin-clavulanate (AUGMENTIN) 875-125 MG tablet Take 1 tablet by mouth every 12 (twelve) hours for 10 days. 20 tablet 0   Blood Glucose Monitoring Suppl (FREESTYLE LITE) w/Device KIT Use as directed to check blood sugar 1 kit 0   clotrimazole-betamethasone (LOTRISONE) cream Apply 1 application topically 2 (two) times  daily as directed 30 g 1   cyclobenzaprine (FLEXERIL) 5 MG tablet Take 1 tablet (5 mg total) by mouth 3 (three) times daily as needed for 7 days. 21  tablet 0   doxycycline (VIBRAMYCIN) 100 MG capsule Take 1 capsule (100 mg total) by mouth 2 (two) times daily for 7 days. 14 capsule 0   ferrous sulfate (SLOW FE) 160 (50 Fe) MG TBCR SR tablet Take by mouth.     fluticasone (FLONASE) 50 MCG/ACT nasal spray Place 1 spray into both nostrils daily for 10 days 16 g 0   glucose blood (FREESTYLE LITE) test strip Use 1 test strip to test blood sugar once daily 100 strip 3   HYDROcodone bit-homatropine (HYCODAN) 5-1.5 MG/5ML syrup Take 5 mLs by mouth every 6 (six) hours as needed. 100 mL 0   ibuprofen (ADVIL) 600 MG tablet Take 1 tablet (600 mg total) by mouth every 4-6 hours 14 tablet 0   Lancets (FREESTYLE) lancets Use 1 lancet to check blood sugar once daily 100 each 3   lisdexamfetamine (VYVANSE) 60 MG capsule TAKE 1 CAPSULE BY MOUTH EVERY MORNING 30 capsule 0   lisdexamfetamine (VYVANSE) 60 MG capsule TAKE 1 CAPSULE BY MOUTH EVERY MORNING 30 capsule 0   lisdexamfetamine (VYVANSE) 60 MG capsule TAKE 1 CAPSULE BY MOUTH EVERY MORNING 30 capsule 0   lisdexamfetamine (VYVANSE) 60 MG capsule TAKE 1 CAPSULE BY MOUTH EVERY MORNING 30 capsule 0   lisdexamfetamine (VYVANSE) 60 MG capsule TAKE 1 CAPSULE BY MOUTH EVERY MORNING **06/18/20** 30 capsule 0   lisdexamfetamine (VYVANSE) 60 MG capsule Take 1 capsule by mouth every morning every morning - Please fill 30 days after the previous Vyvanse fill 30 capsule 0   lisdexamfetamine (VYVANSE) 60 MG capsule Take 1 capsule (60 mg total) by mouth every morning. 30 capsule 0   losartan-hydrochlorothiazide (HYZAAR) 100-25 MG tablet Take 1 tablet by mouth every morning. 90 tablet 0   meloxicam (MOBIC) 15 MG tablet Take 1 tablet (15 mg total) by mouth daily. 30 tablet 0   Multiple Vitamin (MULTIVITAMIN) tablet Take by mouth.     predniSONE (DELTASONE) 20 MG tablet Take 2 tablets by mouth in the morning for 5 days, then take 1 tablet in the morning for 5 days. 15 tablet 0   propranolol ER (INDERAL LA) 120 MG 24 hr capsule  Take 1 capsule by mouth once at bedtime (Patient not taking: Reported on 11/05/2020) 90 capsule 0   Rimegepant Sulfate (NURTEC) 75 MG TBDP Take 1 tablet by mouth as needed (take 1 tablet at the earliest onset of a mirgraine. max 1 tablet in 24 hours). 16 tablet 0   rosuvastatin (CRESTOR) 10 MG tablet TAKE 1 TABLET BY MOUTH ONCE A WEEK 13 tablet 3   rosuvastatin (CRESTOR) 10 MG tablet take 1 tablet by mouth once a week 13 tablet 2   rosuvastatin (CRESTOR) 10 MG tablet Take 1 tablet (10 mg total) by mouth 2 (two) times a week. 24 tablet 3   SitaGLIPtin-MetFORMIN HCl (JANUMET XR) 50-500 MG TB24 Take 1 tablet by mouth 2 (two) times daily with a meal. 180 tablet 0   SitaGLIPtin-MetFORMIN HCl 50-500 MG TB24 TAKE 1 TABLET BY MOUTH TWICE A DAY WITH MEALS (Patient not taking: Reported on 11/05/2020) 180 tablet 1   spironolactone (ALDACTONE) 25 MG tablet Take 1 tablet (25 mg total) by mouth daily. 90 tablet 0   SUMAtriptan (IMITREX) 100 MG tablet TAKE 1 TABLET BY MOUTH IMMEDIATELY AT ONSET OF HEADACHE,  MAY REPEAT IN 2 HOURS FOR PERSISTENT HEADACHE PAIN 9 tablet 3   topiramate (TOPAMAX) 50 MG tablet Take 1 tablet (50 mg total) by mouth at bedtime. 90 tablet 1   [DISCONTINUED] hydrochlorothiazide (,MICROZIDE/HYDRODIURIL,) 12.5 MG capsule Take 12.5 mg by mouth daily.   (Patient not taking: Reported on 11/05/2020)     [DISCONTINUED] losartan (COZAAR) 100 MG tablet Take 100 mg by mouth daily.   (Patient not taking: Reported on 11/05/2020)     No current facility-administered medications on file prior to visit.    ALLERGIES: Allergies  Allergen Reactions   Latex     FAMILY HISTORY: No family history on file.    Objective:  *** General: No acute distress.  Patient appears ***-groomed.   Head:  Normocephalic/atraumatic Eyes:  Fundi examined but not visualized Neck: supple, no paraspinal tenderness, full range of motion Heart:  Regular rate and rhythm Lungs:  Clear to auscultation bilaterally Back: No  paraspinal tenderness Neurological Exam: alert and oriented to person, place, and time.  Speech fluent and not dysarthric, language intact.  CN II-XII intact. Bulk and tone normal, muscle strength 5/5 throughout.  Sensation to light touch intact.  Deep tendon reflexes 2+ throughout, toes downgoing.  Finger to nose testing intact.  Gait normal, Romberg negative.   Metta Clines, DO  CC: ***

## 2021-11-18 ENCOUNTER — Encounter: Payer: Self-pay | Admitting: Neurology

## 2021-11-18 ENCOUNTER — Ambulatory Visit: Payer: 59 | Admitting: Neurology

## 2021-11-18 DIAGNOSIS — Z029 Encounter for administrative examinations, unspecified: Secondary | ICD-10-CM

## 2021-12-11 ENCOUNTER — Other Ambulatory Visit (HOSPITAL_COMMUNITY): Payer: Self-pay

## 2021-12-13 ENCOUNTER — Other Ambulatory Visit (HOSPITAL_COMMUNITY): Payer: Self-pay

## 2021-12-13 MED ORDER — JANUMET XR 50-500 MG PO TB24
1.0000 | ORAL_TABLET | Freq: Two times a day (BID) | ORAL | 0 refills | Status: DC
Start: 2021-12-12 — End: 2023-11-24
  Filled 2021-12-13: qty 60, 30d supply, fill #0
  Filled 2022-08-06 – 2022-08-10 (×2): qty 60, 30d supply, fill #1

## 2021-12-15 ENCOUNTER — Other Ambulatory Visit (HOSPITAL_COMMUNITY): Payer: Self-pay

## 2021-12-16 ENCOUNTER — Other Ambulatory Visit (HOSPITAL_COMMUNITY): Payer: Self-pay

## 2021-12-19 ENCOUNTER — Other Ambulatory Visit (HOSPITAL_COMMUNITY): Payer: Self-pay

## 2021-12-25 ENCOUNTER — Other Ambulatory Visit (HOSPITAL_COMMUNITY): Payer: Self-pay

## 2021-12-25 MED ORDER — FARXIGA 10 MG PO TABS
10.0000 mg | ORAL_TABLET | Freq: Every day | ORAL | 0 refills | Status: DC
  Filled 2021-12-25: qty 90, 90d supply, fill #0

## 2021-12-25 MED ORDER — LISDEXAMFETAMINE DIMESYLATE 60 MG PO CAPS
60.0000 mg | ORAL_CAPSULE | Freq: Every day | ORAL | 0 refills | Status: DC
  Filled 2021-12-25: qty 30, 30d supply, fill #0

## 2021-12-26 ENCOUNTER — Other Ambulatory Visit (HOSPITAL_COMMUNITY): Payer: Self-pay

## 2022-01-26 ENCOUNTER — Other Ambulatory Visit (HOSPITAL_COMMUNITY): Payer: Self-pay

## 2022-01-26 MED ORDER — LOSARTAN POTASSIUM-HCTZ 100-25 MG PO TABS
1.0000 | ORAL_TABLET | Freq: Every morning | ORAL | 0 refills | Status: DC
Start: 2022-01-26 — End: 2022-04-29
  Filled 2022-01-26: qty 90, 90d supply, fill #0

## 2022-02-06 ENCOUNTER — Other Ambulatory Visit (HOSPITAL_COMMUNITY): Payer: Self-pay

## 2022-02-06 DIAGNOSIS — E119 Type 2 diabetes mellitus without complications: Secondary | ICD-10-CM | POA: Diagnosis not present

## 2022-02-06 DIAGNOSIS — E7800 Pure hypercholesterolemia, unspecified: Secondary | ICD-10-CM | POA: Diagnosis not present

## 2022-02-06 DIAGNOSIS — I10 Essential (primary) hypertension: Secondary | ICD-10-CM | POA: Diagnosis not present

## 2022-02-06 DIAGNOSIS — M25562 Pain in left knee: Secondary | ICD-10-CM | POA: Diagnosis not present

## 2022-02-06 DIAGNOSIS — F900 Attention-deficit hyperactivity disorder, predominantly inattentive type: Secondary | ICD-10-CM | POA: Diagnosis not present

## 2022-02-06 DIAGNOSIS — M545 Low back pain, unspecified: Secondary | ICD-10-CM | POA: Diagnosis not present

## 2022-02-06 MED ORDER — LISDEXAMFETAMINE DIMESYLATE 60 MG PO CAPS
60.0000 mg | ORAL_CAPSULE | Freq: Every morning | ORAL | 0 refills | Status: DC
  Filled 2022-02-06: qty 30, 30d supply, fill #0

## 2022-02-06 MED ORDER — METHOCARBAMOL 500 MG PO TABS
500.0000 mg | ORAL_TABLET | ORAL | 0 refills | Status: DC
  Filled 2022-02-06: qty 40, 7d supply, fill #0

## 2022-02-06 MED ORDER — MELOXICAM 7.5 MG PO TABS
7.5000 mg | ORAL_TABLET | Freq: Every day | ORAL | 0 refills | Status: DC
Start: 2022-02-06 — End: 2023-11-24
  Filled 2022-02-06: qty 30, 30d supply, fill #0

## 2022-02-06 MED ORDER — JANUMET XR 50-500 MG PO TB24
1.0000 | ORAL_TABLET | Freq: Two times a day (BID) | ORAL | 1 refills | Status: DC
Start: 2022-02-06 — End: 2023-01-02
  Filled 2022-02-06: qty 60, 30d supply, fill #0
  Filled 2022-04-01: qty 60, 30d supply, fill #1
  Filled 2022-04-29: qty 60, 30d supply, fill #2
  Filled 2022-07-01: qty 60, 30d supply, fill #3
  Filled 2022-08-10: qty 60, 30d supply, fill #4
  Filled 2022-10-16: qty 60, 30d supply, fill #5

## 2022-02-09 ENCOUNTER — Other Ambulatory Visit (HOSPITAL_COMMUNITY): Payer: Self-pay

## 2022-02-10 ENCOUNTER — Other Ambulatory Visit (HOSPITAL_COMMUNITY): Payer: Self-pay

## 2022-02-11 DIAGNOSIS — M25562 Pain in left knee: Secondary | ICD-10-CM | POA: Diagnosis not present

## 2022-02-11 DIAGNOSIS — M546 Pain in thoracic spine: Secondary | ICD-10-CM | POA: Diagnosis not present

## 2022-02-11 DIAGNOSIS — M545 Low back pain, unspecified: Secondary | ICD-10-CM | POA: Diagnosis not present

## 2022-02-11 DIAGNOSIS — M1712 Unilateral primary osteoarthritis, left knee: Secondary | ICD-10-CM | POA: Diagnosis not present

## 2022-02-12 ENCOUNTER — Other Ambulatory Visit (HOSPITAL_COMMUNITY): Payer: Self-pay

## 2022-02-14 ENCOUNTER — Other Ambulatory Visit (HOSPITAL_COMMUNITY): Payer: Self-pay

## 2022-02-14 MED ORDER — SPIRONOLACTONE 25 MG PO TABS
25.0000 mg | ORAL_TABLET | Freq: Every day | ORAL | 0 refills | Status: DC
  Filled 2022-02-14: qty 90, 90d supply, fill #0

## 2022-04-01 ENCOUNTER — Other Ambulatory Visit (HOSPITAL_COMMUNITY): Payer: Self-pay

## 2022-04-02 ENCOUNTER — Other Ambulatory Visit (HOSPITAL_COMMUNITY): Payer: Self-pay

## 2022-04-02 MED ORDER — LISDEXAMFETAMINE DIMESYLATE 60 MG PO CAPS
60.0000 mg | ORAL_CAPSULE | Freq: Every morning | ORAL | 0 refills | Status: DC
  Filled 2022-04-02: qty 30, 30d supply, fill #0

## 2022-04-09 ENCOUNTER — Other Ambulatory Visit (HOSPITAL_COMMUNITY): Payer: Self-pay

## 2022-04-09 DIAGNOSIS — L920 Granuloma annulare: Secondary | ICD-10-CM | POA: Diagnosis not present

## 2022-04-09 DIAGNOSIS — L918 Other hypertrophic disorders of the skin: Secondary | ICD-10-CM | POA: Diagnosis not present

## 2022-04-09 MED ORDER — CLOBETASOL PROPIONATE 0.05 % EX CREA
TOPICAL_CREAM | CUTANEOUS | 3 refills | Status: DC
  Filled 2022-04-09: qty 60, 30d supply, fill #0
  Filled 2023-03-05: qty 60, 30d supply, fill #1

## 2022-04-29 ENCOUNTER — Other Ambulatory Visit: Payer: Self-pay

## 2022-04-29 ENCOUNTER — Other Ambulatory Visit (HOSPITAL_COMMUNITY): Payer: Self-pay

## 2022-04-29 ENCOUNTER — Other Ambulatory Visit: Payer: Self-pay | Admitting: Neurology

## 2022-04-29 MED ORDER — LOSARTAN POTASSIUM-HCTZ 100-25 MG PO TABS
1.0000 | ORAL_TABLET | Freq: Every morning | ORAL | 0 refills | Status: DC
  Filled 2022-04-29: qty 90, 90d supply, fill #0

## 2022-05-01 ENCOUNTER — Other Ambulatory Visit (HOSPITAL_COMMUNITY): Payer: Self-pay

## 2022-05-01 MED ORDER — DAPAGLIFLOZIN PROPANEDIOL 10 MG PO TABS
10.0000 mg | ORAL_TABLET | Freq: Every day | ORAL | 0 refills | Status: DC
  Filled 2022-05-01 – 2023-04-13 (×2): qty 90, 90d supply, fill #0

## 2022-05-01 MED ORDER — TOPIRAMATE 50 MG PO TABS
50.0000 mg | ORAL_TABLET | Freq: Every day | ORAL | 0 refills | Status: DC
  Filled 2022-05-01: qty 90, 90d supply, fill #0

## 2022-05-01 MED ORDER — SPIRONOLACTONE 25 MG PO TABS
25.0000 mg | ORAL_TABLET | Freq: Every day | ORAL | 0 refills | Status: DC
  Filled 2022-05-01: qty 90, 90d supply, fill #0

## 2022-05-02 ENCOUNTER — Other Ambulatory Visit (HOSPITAL_COMMUNITY): Payer: Self-pay

## 2022-05-11 ENCOUNTER — Other Ambulatory Visit (HOSPITAL_COMMUNITY): Payer: Self-pay

## 2022-05-12 ENCOUNTER — Other Ambulatory Visit (HOSPITAL_COMMUNITY): Payer: Self-pay

## 2022-05-12 DIAGNOSIS — J0100 Acute maxillary sinusitis, unspecified: Secondary | ICD-10-CM | POA: Diagnosis not present

## 2022-05-12 DIAGNOSIS — H6993 Unspecified Eustachian tube disorder, bilateral: Secondary | ICD-10-CM | POA: Diagnosis not present

## 2022-05-12 MED ORDER — AMOXICILLIN-POT CLAVULANATE 875-125 MG PO TABS
875.0000 mg | ORAL_TABLET | Freq: Two times a day (BID) | ORAL | 0 refills | Status: DC
  Filled 2022-05-12: qty 20, 10d supply, fill #0

## 2022-05-14 ENCOUNTER — Other Ambulatory Visit (HOSPITAL_COMMUNITY): Payer: Self-pay

## 2022-05-14 DIAGNOSIS — J019 Acute sinusitis, unspecified: Secondary | ICD-10-CM | POA: Diagnosis not present

## 2022-05-14 DIAGNOSIS — H8113 Benign paroxysmal vertigo, bilateral: Secondary | ICD-10-CM | POA: Diagnosis not present

## 2022-05-14 DIAGNOSIS — H6503 Acute serous otitis media, bilateral: Secondary | ICD-10-CM | POA: Diagnosis not present

## 2022-05-14 MED ORDER — MECLIZINE HCL 25 MG PO TABS
25.0000 mg | ORAL_TABLET | Freq: Three times a day (TID) | ORAL | 0 refills | Status: DC | PRN
  Filled 2022-05-14: qty 30, 10d supply, fill #0

## 2022-06-04 DIAGNOSIS — L920 Granuloma annulare: Secondary | ICD-10-CM | POA: Diagnosis not present

## 2022-06-16 ENCOUNTER — Other Ambulatory Visit (HOSPITAL_COMMUNITY): Payer: Self-pay

## 2022-06-16 DIAGNOSIS — G43909 Migraine, unspecified, not intractable, without status migrainosus: Secondary | ICD-10-CM | POA: Diagnosis not present

## 2022-06-16 MED ORDER — NURTEC 75 MG PO TBDP
ORAL_TABLET | ORAL | 2 refills | Status: DC
  Filled 2022-06-16: qty 8, 28d supply, fill #0

## 2022-06-29 ENCOUNTER — Other Ambulatory Visit (HOSPITAL_COMMUNITY): Payer: Self-pay

## 2022-06-29 DIAGNOSIS — H5789 Other specified disorders of eye and adnexa: Secondary | ICD-10-CM | POA: Diagnosis not present

## 2022-06-29 MED ORDER — OLOPATADINE HCL 0.2 % OP SOLN
OPHTHALMIC | 0 refills | Status: DC
  Filled 2022-06-29: qty 2.5, 7d supply, fill #0

## 2022-07-01 ENCOUNTER — Other Ambulatory Visit (HOSPITAL_COMMUNITY): Payer: Self-pay

## 2022-07-01 MED ORDER — LISDEXAMFETAMINE DIMESYLATE 60 MG PO CAPS
60.0000 mg | ORAL_CAPSULE | Freq: Every morning | ORAL | 0 refills | Status: DC
  Filled 2022-07-01: qty 30, 30d supply, fill #0

## 2022-07-02 ENCOUNTER — Other Ambulatory Visit: Payer: Self-pay

## 2022-07-02 ENCOUNTER — Other Ambulatory Visit (HOSPITAL_COMMUNITY): Payer: Self-pay

## 2022-08-06 ENCOUNTER — Other Ambulatory Visit (HOSPITAL_COMMUNITY): Payer: Self-pay

## 2022-08-08 ENCOUNTER — Other Ambulatory Visit (HOSPITAL_COMMUNITY): Payer: Self-pay

## 2022-08-08 MED ORDER — LOSARTAN POTASSIUM-HCTZ 100-25 MG PO TABS
1.0000 | ORAL_TABLET | Freq: Every morning | ORAL | 1 refills | Status: DC
  Filled 2022-08-08: qty 90, 90d supply, fill #0
  Filled 2022-11-20: qty 90, 90d supply, fill #1

## 2022-08-10 ENCOUNTER — Other Ambulatory Visit (HOSPITAL_COMMUNITY): Payer: Self-pay

## 2022-08-10 MED ORDER — LISDEXAMFETAMINE DIMESYLATE 60 MG PO CAPS
60.0000 mg | ORAL_CAPSULE | Freq: Every morning | ORAL | 0 refills | Status: DC
  Filled 2022-08-10: qty 30, 30d supply, fill #0

## 2022-08-11 ENCOUNTER — Other Ambulatory Visit (HOSPITAL_COMMUNITY): Payer: Self-pay

## 2022-08-17 ENCOUNTER — Other Ambulatory Visit (HOSPITAL_COMMUNITY): Payer: Self-pay

## 2022-08-18 ENCOUNTER — Other Ambulatory Visit: Payer: Self-pay

## 2022-08-19 ENCOUNTER — Other Ambulatory Visit (HOSPITAL_COMMUNITY): Payer: Self-pay

## 2022-08-20 ENCOUNTER — Other Ambulatory Visit (HOSPITAL_COMMUNITY): Payer: Self-pay

## 2022-08-20 MED ORDER — TOPIRAMATE 50 MG PO TABS
50.0000 mg | ORAL_TABLET | Freq: Every day | ORAL | 0 refills | Status: DC
  Filled 2022-08-20: qty 90, 90d supply, fill #0

## 2022-08-26 ENCOUNTER — Other Ambulatory Visit (HOSPITAL_COMMUNITY): Payer: Self-pay

## 2022-08-27 ENCOUNTER — Other Ambulatory Visit (HOSPITAL_COMMUNITY): Payer: Self-pay

## 2022-09-04 ENCOUNTER — Other Ambulatory Visit (HOSPITAL_COMMUNITY): Payer: Self-pay

## 2022-09-04 DIAGNOSIS — G43909 Migraine, unspecified, not intractable, without status migrainosus: Secondary | ICD-10-CM | POA: Diagnosis not present

## 2022-09-04 MED ORDER — TOPIRAMATE 50 MG PO TABS
100.0000 mg | ORAL_TABLET | Freq: Every day | ORAL | 2 refills | Status: DC
  Filled 2022-09-04: qty 180, 90d supply, fill #0

## 2022-09-04 MED ORDER — NURTEC 75 MG PO TBDP
75.0000 mg | ORAL_TABLET | Freq: Every day | ORAL | 4 refills | Status: AC | PRN
  Filled 2022-09-04: qty 8, 8d supply, fill #0
  Filled 2022-09-28: qty 8, 28d supply, fill #0
  Filled 2023-03-05: qty 8, 28d supply, fill #1
  Filled 2023-05-24: qty 8, 28d supply, fill #2

## 2022-09-07 ENCOUNTER — Other Ambulatory Visit (HOSPITAL_COMMUNITY): Payer: Self-pay

## 2022-09-08 ENCOUNTER — Other Ambulatory Visit (HOSPITAL_COMMUNITY): Payer: Self-pay

## 2022-09-08 MED ORDER — SPIRONOLACTONE 25 MG PO TABS
25.0000 mg | ORAL_TABLET | Freq: Every day | ORAL | 0 refills | Status: DC
  Filled 2022-09-08: qty 90, 90d supply, fill #0

## 2022-09-19 ENCOUNTER — Telehealth: Payer: Self-pay | Admitting: Pharmacy Technician

## 2022-09-19 NOTE — Telephone Encounter (Signed)
Pharmacy Patient Advocate Encounter   Received notification from CoverMyMeds that prior authorization for NURTEC 75MG  is required/requested.   Insurance verification completed.   The patient is insured through [facility] .   Per test claim: PA submitted to [facility] via CoverMyMeds Key/confirmation #/EOC [id number] Status is pending

## 2022-09-21 ENCOUNTER — Other Ambulatory Visit (HOSPITAL_COMMUNITY): Payer: Self-pay

## 2022-09-21 NOTE — Telephone Encounter (Signed)
Pharmacy Patient Advocate Encounter  Received notification from [facility] that Prior Authorization for Nurtec 75MG  dispersible tablets has been APPROVED from 09-20-2022 to 09-18-2023.Marland Kitchen  PA #/Case ID/Reference #: [id number]  Co-pay is $0.00 per 30 day supply. Quantity approved 16 tablets per 30 days.

## 2022-09-26 DIAGNOSIS — R9082 White matter disease, unspecified: Secondary | ICD-10-CM | POA: Diagnosis not present

## 2022-09-26 DIAGNOSIS — G43909 Migraine, unspecified, not intractable, without status migrainosus: Secondary | ICD-10-CM | POA: Diagnosis not present

## 2022-09-28 ENCOUNTER — Other Ambulatory Visit (HOSPITAL_COMMUNITY): Payer: Self-pay

## 2022-09-28 DIAGNOSIS — G43909 Migraine, unspecified, not intractable, without status migrainosus: Secondary | ICD-10-CM | POA: Diagnosis not present

## 2022-09-28 DIAGNOSIS — G43E09 Chronic migraine with aura, not intractable, without status migrainosus: Secondary | ICD-10-CM | POA: Diagnosis not present

## 2022-09-29 ENCOUNTER — Encounter (HOSPITAL_BASED_OUTPATIENT_CLINIC_OR_DEPARTMENT_OTHER): Payer: Self-pay

## 2022-09-29 ENCOUNTER — Other Ambulatory Visit (HOSPITAL_COMMUNITY): Payer: Self-pay

## 2022-09-29 ENCOUNTER — Emergency Department (HOSPITAL_BASED_OUTPATIENT_CLINIC_OR_DEPARTMENT_OTHER)
Admission: EM | Admit: 2022-09-29 | Discharge: 2022-09-29 | Disposition: A | Discharge status: Home / Self Care | Payer: Commercial Managed Care - PPO | Attending: Emergency Medicine | Admitting: Emergency Medicine

## 2022-09-29 ENCOUNTER — Emergency Department (HOSPITAL_BASED_OUTPATIENT_CLINIC_OR_DEPARTMENT_OTHER): Payer: Commercial Managed Care - PPO

## 2022-09-29 ENCOUNTER — Other Ambulatory Visit: Payer: Self-pay

## 2022-09-29 DIAGNOSIS — Z7984 Long term (current) use of oral hypoglycemic drugs: Secondary | ICD-10-CM | POA: Insufficient documentation

## 2022-09-29 DIAGNOSIS — Z79899 Other long term (current) drug therapy: Secondary | ICD-10-CM | POA: Insufficient documentation

## 2022-09-29 DIAGNOSIS — Z91040 Latex allergy status: Secondary | ICD-10-CM | POA: Diagnosis not present

## 2022-09-29 DIAGNOSIS — G43909 Migraine, unspecified, not intractable, without status migrainosus: Secondary | ICD-10-CM | POA: Diagnosis present

## 2022-09-29 DIAGNOSIS — R110 Nausea: Secondary | ICD-10-CM | POA: Diagnosis not present

## 2022-09-29 DIAGNOSIS — R519 Headache, unspecified: Secondary | ICD-10-CM

## 2022-09-29 DIAGNOSIS — R55 Syncope and collapse: Secondary | ICD-10-CM | POA: Diagnosis not present

## 2022-09-29 DIAGNOSIS — I10 Essential (primary) hypertension: Secondary | ICD-10-CM | POA: Insufficient documentation

## 2022-09-29 HISTORY — DX: Migraine, unspecified, not intractable, without status migrainosus: G43.909

## 2022-09-29 LAB — CBC WITH DIFFERENTIAL/PLATELET
Abs Immature Granulocytes: 0.05 10*3/uL (ref 0.00–0.07)
Basophils Absolute: 0 10*3/uL (ref 0.0–0.1)
Basophils Relative: 0 %
Eosinophils Absolute: 0.1 10*3/uL (ref 0.0–0.5)
Eosinophils Relative: 1 %
HCT: 43.5 % (ref 36.0–46.0)
Hemoglobin: 14.3 g/dL (ref 12.0–15.0)
Immature Granulocytes: 1 %
Lymphocytes Relative: 26 %
Lymphs Abs: 2.8 10*3/uL (ref 0.7–4.0)
MCH: 30 pg (ref 26.0–34.0)
MCHC: 32.9 g/dL (ref 30.0–36.0)
MCV: 91.4 fL (ref 80.0–100.0)
Monocytes Absolute: 1.1 10*3/uL — ABNORMAL HIGH (ref 0.1–1.0)
Monocytes Relative: 11 %
Neutro Abs: 6.5 10*3/uL (ref 1.7–7.7)
Neutrophils Relative %: 61 %
Platelets: 407 10*3/uL — ABNORMAL HIGH (ref 150–400)
RBC: 4.76 MIL/uL (ref 3.87–5.11)
RDW: 13.6 % (ref 11.5–15.5)
WBC: 10.6 10*3/uL — ABNORMAL HIGH (ref 4.0–10.5)
nRBC: 0 % (ref 0.0–0.2)

## 2022-09-29 LAB — BASIC METABOLIC PANEL
Anion gap: 9 (ref 5–15)
BUN: 35 mg/dL — ABNORMAL HIGH (ref 6–20)
CO2: 28 mmol/L (ref 22–32)
Calcium: 10.2 mg/dL (ref 8.9–10.3)
Chloride: 101 mmol/L (ref 98–111)
Creatinine, Ser: 1.66 mg/dL — ABNORMAL HIGH (ref 0.44–1.00)
GFR, Estimated: 36 mL/min — ABNORMAL LOW (ref >60)
Glucose, Bld: 108 mg/dL — ABNORMAL HIGH (ref 70–99)
Potassium: 3.9 mmol/L (ref 3.5–5.1)
Sodium: 138 mmol/L (ref 135–145)

## 2022-09-29 LAB — PREGNANCY, URINE: Preg Test, Ur: NEGATIVE

## 2022-09-29 LAB — MAGNESIUM: Magnesium: 2.3 mg/dL (ref 1.7–2.4)

## 2022-09-29 MED ORDER — DIPHENHYDRAMINE HCL 50 MG/ML IJ SOLN
12.5000 mg | Freq: Once | INTRAMUSCULAR | Status: AC
  Administered 2022-09-29: 12.5 mg via INTRAVENOUS
  Filled 2022-09-29: qty 1

## 2022-09-29 MED ORDER — METOCLOPRAMIDE HCL 5 MG/ML IJ SOLN
10.0000 mg | Freq: Once | INTRAMUSCULAR | Status: AC
  Administered 2022-09-29: 10 mg via INTRAVENOUS
  Filled 2022-09-29: qty 2

## 2022-09-29 MED ORDER — LISDEXAMFETAMINE DIMESYLATE 60 MG PO CAPS
60.0000 mg | ORAL_CAPSULE | Freq: Every morning | ORAL | 0 refills | Status: DC
  Filled 2022-09-29: qty 30, 30d supply, fill #0

## 2022-09-29 MED ORDER — KETOROLAC TROMETHAMINE 15 MG/ML IJ SOLN
15.0000 mg | Freq: Once | INTRAMUSCULAR | Status: AC
  Administered 2022-09-29: 15 mg via INTRAVENOUS
  Filled 2022-09-29: qty 1

## 2022-09-29 MED ORDER — LACTATED RINGERS IV BOLUS
1000.0000 mL | Freq: Once | INTRAVENOUS | Status: AC
  Administered 2022-09-29: 1000 mL via INTRAVENOUS

## 2022-09-29 MED ORDER — IOHEXOL 350 MG/ML SOLN
100.0000 mL | Freq: Once | INTRAVENOUS | Status: AC | PRN
  Administered 2022-09-29: 65 mL via INTRAVENOUS

## 2022-09-29 NOTE — Discharge Instructions (Addendum)
Your creatinine (marker of kidney function) today was 1.66.  Talk to your primary care doctor about this.  This may be your baseline.  In the meantime, stay hydrated.  Return to the emergency department for any new or worsening symptoms of concern.

## 2022-09-29 NOTE — ED Notes (Signed)
Patient given something to drink.

## 2022-09-29 NOTE — ED Triage Notes (Signed)
Pt presents to triage with migraine for 5 days. Pt reports a history of migraines but current medications are not working. Pt reports having a "moment" at work for approximately 20 minutes with being dizzy after standing up last Wednesday and has had migraine ever since. Saw neurologist Saturday following MRI, had injection yesterday. Was advised if had any more episodes to come to ER for evaluation and treatment.

## 2022-09-29 NOTE — ED Provider Triage Note (Signed)
Emergency Medicine Provider Triage Evaluation Note  Tara House , a 57 y.o. female  was evaluated in triage.  Pt complains of migraine, onset last Wednesday, constant. States her employer witnessed- the vein to the right forehead area popped out, patient was coherent and able to talk through the "episode." This "episode" happened last Wednesday, patient stood up, felt dizzy (light headed and room spinning) and sat back down. Patient looked to the left and grabbed the chair and called her coworker, told her coworker she needed the supervisor who came to her and she told her "didn't feel right." Patient had taken her Nurtek. Patient was quiet for a minute and then returned to baseline.  Ever since then, has posterior head pain, Nurtek eases the pain but it is still present.   No unilateral weakness, numbness, no changes in speech.   Has occasional orthostatic hypotension and knows she needs to stand gradually but doesn't always.   Review of Systems  Positive:  Negative:   Physical Exam  BP (!) 137/92 (BP Location: Left Arm)   Pulse 90   Temp 98.4 F (36.9 C)   Resp 18   Ht 5\' 3"  (1.6 m)   Wt 78.9 kg   SpO2 100%   BMI 30.82 kg/m  Gen:   Awake, no distress   Resp:  Normal effort  MSK:   Moves extremities without difficulty  Other:    Medical Decision Making  Medically screening exam initiated at 4:22 PM.  Appropriate orders placed.  Tara House [place] was informed that the remainder of the evaluation will be completed by another provider, this initial triage assessment does not replace that evaluation, and the importance of remaining in the ED until their evaluation is complete.  MRI brain/angio normal 08/2020   Jeannie Fend, PA-C 09/29/22 1628

## 2022-09-29 NOTE — ED Provider Notes (Signed)
[place] EMERGENCY DEPARTMENT AT [facility] Provider Note   CSN: [id number] Arrival date & time: 09/29/22  1543     History  Chief Complaint  Patient presents with   Migraine    Tara House is a 57 y.o. female.   Migraine Associated symptoms include headaches.  Patient presents for headache.  Medical history includes DM, HTN, migraine headaches.  She had onset of a headache 6 days ago.  Severity has been waxing and waning.  She underwent a scheduled MRI of her brain on Saturday.  She was seen by her neurologist yesterday.  She received intramuscular shots of Toradol and steroid.  Today, she has taken her home dose of Nurtec.  She continues to have a headache that is 8.5/10 in severity.  Pain is located mostly in the right parietal area.  She has light sensitivity.  She has some very mild nausea.  She denies any other current symptoms.     Home Medications Prior to Admission medications   Medication Sig Start Date End Date Taking? Authorizing Provider  amoxicillin-clavulanate (AUGMENTIN) 875-125 MG tablet Take 1 tablet by mouth every 12 (twelve) hours for 10 days. 04/25/21     amoxicillin-clavulanate (AUGMENTIN) 875-125 MG tablet Take 1 tablet by mouth every 12 (twelve) hours for 10 days. 05/12/22     Blood Glucose Monitoring Suppl (FREESTYLE LITE) w/Device KIT Use as directed to check blood sugar 07/30/21     clobetasol cream (TEMOVATE) 0.05 % Apply to affected area on arms twice daily for 2 weeks. Take 2 weeks off, then repeat cycle. 04/09/22     clotrimazole-betamethasone (LOTRISONE) cream Apply 1 application topically 2 (two) times daily as directed 10/29/21     cyclobenzaprine (FLEXERIL) 5 MG tablet Take 1 tablet (5 mg total) by mouth 3 (three) times daily as needed for 7 days. 12/02/20     dapagliflozin propanediol (FARXIGA) 10 MG TABS tablet Take 1 tablet (10 mg total) by mouth daily. 04/30/22     doxycycline (VIBRAMYCIN) 100 MG capsule Take 1 capsule (100 mg  total) by mouth 2 (two) times daily for 7 days. 04/18/21     ferrous sulfate (SLOW FE) 160 (50 Fe) MG TBCR SR tablet Take by mouth.    [provider]  fluticasone (FLONASE) 50 MCG/ACT nasal spray Place 1 spray into both nostrils daily for 10 days 04/25/21     glucose blood (FREESTYLE LITE) test strip Use 1 test strip to test blood sugar once daily 07/30/21     HYDROcodone bit-homatropine (HYCODAN) 5-1.5 MG/5ML syrup Take 5 mLs by mouth every 6 (six) hours as needed. 11/13/20     ibuprofen (ADVIL) 600 MG tablet Take 1 tablet (600 mg total) by mouth every 4-6 hours 02/14/21     Lancets (FREESTYLE) lancets Use 1 lancet to check blood sugar once daily 07/30/21     lisdexamfetamine (VYVANSE) 60 MG capsule TAKE 1 CAPSULE BY MOUTH EVERY MORNING 01/13/20 07/11/20  Elias Else, MD  lisdexamfetamine (VYVANSE) 60 MG capsule TAKE 1 CAPSULE BY MOUTH EVERY MORNING 01/13/20 07/11/20  Elias Else, MD  lisdexamfetamine (VYVANSE) 60 MG capsule TAKE 1 CAPSULE BY MOUTH EVERY MORNING 01/13/20 07/11/20  Elias Else, MD  lisdexamfetamine (VYVANSE) 60 MG capsule TAKE 1 CAPSULE BY MOUTH EVERY MORNING 12/08/19 06/05/20  Laurann Montana, MD  lisdexamfetamine (VYVANSE) 60 MG capsule TAKE 1 CAPSULE BY MOUTH EVERY MORNING **06/18/20** 05/19/20 11/15/20  Elias Else, MD  lisdexamfetamine (VYVANSE) 60 MG capsule Take 1 capsule by mouth every morning every  morning - Please fill 30 days after the previous Vyvanse fill 07/10/20     lisdexamfetamine (VYVANSE) 60 MG capsule Take 1 capsule (60 mg total) by mouth every morning. 07/01/22     lisdexamfetamine (VYVANSE) 60 MG capsule Take 1 capsule (60 mg total) by mouth every morning. 09/29/22     losartan-hydrochlorothiazide (HYZAAR) 100-25 MG tablet Take 1 tablet by mouth every morning. 08/07/22     meclizine (ANTIVERT) 25 MG tablet Take 1 tablet (25 mg total) by mouth every 8 (eight) hours as needed for dizziness for 10 days. 05/14/22     meloxicam (MOBIC) 15 MG tablet Take 1 tablet (15 mg  total) by mouth daily. 07/23/21   Louann Sjogren, DPM  meloxicam (MOBIC) 7.5 MG tablet Take 1 tablet (7.5 mg total) by mouth daily. 02/06/22     methocarbamol (ROBAXIN) 500 MG tablet Take 1 tablet (500 mg total) by mouth every 4 (four) hours for 10 days 02/06/22     Multiple Vitamin (MULTIVITAMIN) tablet Take by mouth.    [provider]  Olopatadine HCl 0.2 % SOLN Put 1 drop into affected eye once a day for 7 days 06/29/22     predniSONE (DELTASONE) 20 MG tablet Take 2 tablets by mouth in the morning for 5 days, then take 1 tablet in the morning for 5 days. 12/02/20     propranolol ER (INDERAL LA) 120 MG 24 hr capsule Take 1 capsule by mouth once at bedtime Patient not taking: Reported on 11/05/2020 08/16/20     Rimegepant Sulfate (NURTEC) 75 MG TBDP Take 1 tablet by mouth as needed (take 1 tablet at the earliest onset of a mirgraine. max 1 tablet in 24 hours). 03/24/21   Drema Dallas, DO  Rimegepant Sulfate (NURTEC) 75 MG TBDP Take 1 tablet (75 mg total) by mouth daily as needed for migraine 09/04/22     rosuvastatin (CRESTOR) 10 MG tablet TAKE 1 TABLET BY MOUTH ONCE A WEEK 06/29/19 07/19/20  Elias Else, MD  rosuvastatin (CRESTOR) 10 MG tablet take 1 tablet by mouth once a week 07/24/20     rosuvastatin (CRESTOR) 10 MG tablet Take 1 tablet (10 mg total) by mouth 2 (two) times a week. 10/30/21     SitaGLIPtin-MetFORMIN HCl (JANUMET XR) 50-500 MG TB24 Take 1 tablet by mouth 2 times daily with a meal. 12/12/21     SitaGLIPtin-MetFORMIN HCl (JANUMET XR) 50-500 MG TB24 Take 1 tablet by mouth 2 (two) times daily with a meal. 02/06/22     SitaGLIPtin-MetFORMIN HCl 50-500 MG TB24 TAKE 1 TABLET BY MOUTH TWICE A DAY WITH MEALS Patient not taking: Reported on 11/05/2020 01/13/20 01/12/21  Elias Else, MD  spironolactone (ALDACTONE) 25 MG tablet Take 1 tablet (25 mg total) by mouth daily. 09/08/22     SUMAtriptan (IMITREX) 100 MG tablet TAKE 1 TABLET BY MOUTH IMMEDIATELY AT ONSET OF HEADACHE, MAY REPEAT IN 2  HOURS FOR PERSISTENT HEADACHE PAIN 01/30/20 05/15/21  Elias Else, MD  topiramate (TOPAMAX) 50 MG tablet Take 1 tablet (50 mg total) by mouth at bedtime. 04/30/22     topiramate (TOPAMAX) 50 MG tablet Take 2 tablets (100 mg total) by mouth  at night as directed 09/04/22     hydrochlorothiazide (,MICROZIDE/HYDRODIURIL,) 12.5 MG capsule Take 12.5 mg by mouth daily.   Patient not taking: Reported on 11/05/2020  07/04/21  [provider]  losartan (COZAAR) 100 MG tablet Take 100 mg by mouth daily.   Patient not taking: Reported on 11/05/2020  07/04/21  [provider]      Allergies    Latex and Simvastatin    Review of Systems   Review of Systems  Eyes:  Positive for photophobia.  Gastrointestinal:  Positive for nausea.  Neurological:  Positive for headaches.  All other systems reviewed and are negative.   Physical Exam Updated Vital Signs BP 115/64   Pulse 74   Temp 97.8 F (36.6 C) (Oral)   Resp 18   Ht 5\' 3"  (1.6 m)   Wt 78.9 kg   SpO2 100%   BMI 30.82 kg/m  Physical Exam Vitals and nursing note reviewed.  Constitutional:      General: She is not in acute distress.    Appearance: Normal appearance. She is well-developed. She is not ill-appearing, toxic-appearing or diaphoretic.  HENT:     Head: Normocephalic and atraumatic.     Right Ear: External ear normal.     Left Ear: External ear normal.     Nose: Nose normal.     Mouth/Throat:     Mouth: Mucous membranes are moist.  Eyes:     Extraocular Movements: Extraocular movements intact.     Conjunctiva/sclera: Conjunctivae normal.  Cardiovascular:     Rate and Rhythm: Normal rate and regular rhythm.  Pulmonary:     Effort: Pulmonary effort is normal. No respiratory distress.  Abdominal:     General: There is no distension.  Musculoskeletal:        General: No swelling. Normal range of motion.     Cervical back: Normal range of motion and neck supple.  Skin:    General: Skin is warm and dry.      Coloration: Skin is not jaundiced or pale.  Neurological:     General: No focal deficit present.     Mental Status: She is alert and oriented to person, place, and time.     Cranial Nerves: No cranial nerve deficit.     Sensory: No sensory deficit.     Motor: No weakness.     Coordination: Coordination normal.  Psychiatric:        Mood and Affect: Mood normal.        Behavior: Behavior normal.        Thought Content: Thought content normal.        Judgment: Judgment normal.     ED Results / Procedures / Treatments   Labs (all labs ordered are listed, but only abnormal results are displayed) Labs Reviewed  CBC WITH DIFFERENTIAL/PLATELET - Abnormal; Notable for the following components:      Result Value   WBC 10.6 (*)    Platelets 407 (*)    Monocytes Absolute 1.1 (*)    All other components within normal limits  BASIC METABOLIC PANEL - Abnormal; Notable for the following components:   Glucose, Bld 108 (*)    House 35 (*)    Creatinine, Ser 1.66 (*)    GFR, Estimated 36 (*)    All other components within normal limits  PREGNANCY, URINE  MAGNESIUM    EKG None  Radiology CT ANGIO HEAD NECK W WO CM  Result Date: 09/29/2022 CLINICAL DATA:  Initial evaluation for syncope/presyncope. EXAM: CT ANGIOGRAPHY HEAD AND NECK WITH AND WITHOUT CONTRAST TECHNIQUE: Multidetector CT imaging of the head and neck was performed using the standard protocol during bolus administration of intravenous contrast. Multiplanar CT image reconstructions and MIPs were obtained to evaluate the vascular anatomy. Carotid stenosis measurements (when applicable) are obtained utilizing  NASCET criteria, using the distal internal carotid diameter as the denominator. RADIATION DOSE REDUCTION: This exam was performed according to the departmental dose-optimization program which includes automated exposure control, adjustment of the mA and/or kV according to patient size and/or use of iterative reconstruction technique.  CONTRAST:  65mL OMNIPAQUE IOHEXOL 350 MG/ML SOLN COMPARISON:  None Available. FINDINGS: CT HEAD FINDINGS Brain: Cerebral volume within normal limits for patient age. No evidence for acute intracranial hemorrhage. No findings to suggest acute large vessel territory infarct. No mass lesion, midline shift, or mass effect. Ventricles are normal in size without evidence for hydrocephalus. No extra-axial fluid collection identified. Vascular: No hyperdense vessel identified. Skull: Scalp soft tissues demonstrate no acute abnormality. Calvarium intact. Sinuses/Orbits: Globes and orbital soft tissues within normal limits. Visualized paranasal sinuses are clear. No mastoid effusion. CTA NECK FINDINGS Aortic arch: Standard branching. Imaged portion shows no evidence of aneurysm or dissection. No significant stenosis of the major arch vessel origins. Right carotid system: No evidence of dissection, stenosis (50% or greater), or occlusion. Left carotid system: No evidence of dissection, stenosis (50% or greater), or occlusion. Vertebral arteries: Both vertebral arteries arise from subclavian arteries. Left vertebral artery dominant. No proximal subclavian artery stenosis. No stenosis or dissection. Skeleton: No discrete or worrisome osseous lesions. Mild spondylosis, most pronounced at C5-6. Other neck: No other acute finding.  Prior right hemithyroidectomy. Upper chest: No other acute finding. Mildly prominent right paratracheal node measures at the upper limits of normal at 1 cm in short access. Review of the MIP images confirms the above findings CTA HEAD FINDINGS Anterior circulation: Both internal carotid arteries are widely patent through the siphons without stenosis. A1 segments patent bilaterally. Normal anterior artery complex. Anterior cerebral arteries patent without stenosis. No M1 stenosis or occlusion. No proximal MCA branch occlusion or high-grade stenosis. Distal MCA branches perfused and symmetric. Posterior  circulation: Both V4 segments patent without stenosis. Both PICA patent at their origins. Basilar diminutive but patent without stenosis. Superior cerebellar arteries patent bilaterally. Fetal type origin of the PCAs bilaterally. Both PCAs patent without stenosis. Venous sinuses: Patent allowing for timing the contrast bolus. Anatomic variants: Fetal type origin of the PCAs. No intracranial aneurysm. Review of the MIP images confirms the above findings IMPRESSION: 1. Normal CTA of the head and neck. No large vessel occlusion or other emergent finding. No aneurysm. 2. No other acute intracranial abnormality. Electronically Signed   By: Rise Mu M.D.   On: 09/29/2022 20:28    Procedures Procedures    Medications Ordered in ED Medications  ketorolac (TORADOL) 15 MG/ML injection 15 mg (15 mg Intravenous Given 09/29/22 1754)  metoCLOPramide (REGLAN) injection 10 mg (10 mg Intravenous Given 09/29/22 1753)  diphenhydrAMINE (BENADRYL) injection 12.5 mg (12.5 mg Intravenous Given 09/29/22 1754)  lactated ringers bolus 1,000 mL (0 mLs Intravenous Stopped 09/29/22 2013)  iohexol (OMNIPAQUE) 350 MG/ML injection 100 mL (65 mLs Intravenous Contrast Given 09/29/22 1935)    ED Course/ Medical Decision Making/ A&P                                 Medical Decision Making Amount and/or Complexity of Data Reviewed Labs: ordered. Radiology: ordered.  Risk Prescription drug management.   This patient presents to the ED for concern of headache, this involves an extensive number of treatment options, and is a complaint that carries with it a high risk of complications and morbidity.  The differential  diagnosis includes migraine headache, tension headache, ICH, trauma   Co morbidities that complicate the patient evaluation  DM, HTN, migraine headaches   Additional history obtained:  Additional history obtained from N/A External records from outside source obtained and reviewed including  EMR   Lab Tests:  I Ordered, and personally interpreted labs.  The pertinent results include: Elevated creatinine of unknown chronicity; mild leukocytosis; normal electrolytes   Imaging Studies ordered:  I ordered imaging studies including CTA head and neck I independently visualized and interpreted imaging which showed no acute findings I agree with the radiologist interpretation   Cardiac Monitoring: / EKG:  The patient was maintained on a cardiac monitor.  I personally viewed and interpreted the cardiac monitored which showed an underlying rhythm of: Sinus rhythm  Problem List / ED Course / Critical interventions / Medication management  Patient presents for persistent headache symptoms for the past 6 days.  Severity has been waxing and waning.  Currently it is 8.5/10 in severity.  She is well-appearing on exam.  She has no focal neurologic deficits.  Headache cocktail was ordered.  She underwent a scheduled MRI on Saturday, after onset of her headache symptoms.  Results of this are not available in EMR.  Will obtain lab work and CT imaging today.  CT scan did not show any acute findings.  On reassessment, patient had resolution of headache symptoms.  Lab work is notable for an elevated creatinine.  Chronicity of this is unknown.  Last lab work available in EMR is from 12 years ago.  I spoke with the patient about this.  She states that she does have known chronic kidney disease.  She is not sure what her baseline creatinine is.  Patient was advised to stay hydrated at home and follow-up with her primary care doctor for repeat lab work as needed.  She was discharged in good condition. I ordered medication including IV fluids, Reglan, Toradol, Benadryl for headache Reevaluation of the patient after these medicines showed that the patient resolved I have reviewed the patients home medicines and have made adjustments as needed   Social Determinants of Health:  Has access to outpatient  care         Final Clinical Impression(s) / ED Diagnoses Final diagnoses:  Bad headache    Rx / DC Orders ED Discharge Orders     None         Gloris Manchester, MD 09/29/22 2122

## 2022-10-01 ENCOUNTER — Other Ambulatory Visit (HOSPITAL_COMMUNITY): Payer: Self-pay

## 2022-10-08 ENCOUNTER — Other Ambulatory Visit (HOSPITAL_COMMUNITY): Payer: Self-pay

## 2022-10-08 DIAGNOSIS — G43909 Migraine, unspecified, not intractable, without status migrainosus: Secondary | ICD-10-CM | POA: Diagnosis not present

## 2022-10-08 DIAGNOSIS — E1165 Type 2 diabetes mellitus with hyperglycemia: Secondary | ICD-10-CM | POA: Diagnosis not present

## 2022-10-08 DIAGNOSIS — I10 Essential (primary) hypertension: Secondary | ICD-10-CM | POA: Diagnosis not present

## 2022-10-08 DIAGNOSIS — R519 Headache, unspecified: Secondary | ICD-10-CM | POA: Diagnosis not present

## 2022-10-08 DIAGNOSIS — R42 Dizziness and giddiness: Secondary | ICD-10-CM | POA: Diagnosis not present

## 2022-10-08 DIAGNOSIS — F900 Attention-deficit hyperactivity disorder, predominantly inattentive type: Secondary | ICD-10-CM | POA: Diagnosis not present

## 2022-10-08 DIAGNOSIS — E7800 Pure hypercholesterolemia, unspecified: Secondary | ICD-10-CM | POA: Diagnosis not present

## 2022-10-08 DIAGNOSIS — R944 Abnormal results of kidney function studies: Secondary | ICD-10-CM | POA: Diagnosis not present

## 2022-10-08 DIAGNOSIS — E559 Vitamin D deficiency, unspecified: Secondary | ICD-10-CM | POA: Diagnosis not present

## 2022-10-08 MED ORDER — GLUCOSE BLOOD VI STRP
ORAL_STRIP | 1 refills | Status: AC
  Filled 2022-10-08 – 2022-10-16 (×2): qty 100, 25d supply, fill #0

## 2022-10-08 MED ORDER — FREESTYLE LANCETS MISC
1 refills | Status: AC
  Filled 2022-10-08 – 2022-10-16 (×2): qty 100, 25d supply, fill #0

## 2022-10-08 NOTE — Progress Notes (Signed)
 [address] - AMBULATORY [facility] - [facility]UROLOGY [place] [address] [place] [address]   Date of Visit: 10/08/2022 Patient Name: Tara House Patient ID: [id number] Patient Primary Care:No primary care provider on file.  Chief complaint: Patient presents for review MRI  Location Information: Patient State (at time of visit): [place]  Patient Location (at time of visit):Home/Other Non-Medical  Provider Location: [place]  Is provider licensed to provide clinical care in the current location/state of the patient? Yes   Consent:  Patient's identity was confirmed. Presenting condition or illness was discussed with the patient/personal representative. Current proposed treatment for presenting condition or illness was explained to patient/personal representative along with the likely benefits and any significant risks or complications associated with the provision of treatment by audio/video means. The patient/personal representative verbally authorized treatment to be provided by audio/video, which may include a limited review of patient's current health status, medication, or other treatment recommendations, patient education, and an opportunity to ask questions about condition and treatment. Verbal Consent Granted by Patient/Personal Representative:Yes   Visit Information: Modality: Audio-Only  Time Spent on Phone w/ Patient: 88     Data Reviewed    HPI:  57 year old female patient presents for telephone visit regarding migraine headaches.  She was taking Topamax  but stopped taking the medication due to issues with her kidneys.  She is now only taking Nurtec.  He went to the emergency room on September 29, 2022 with complaints of a severe headache.  Her creatinine was found to be elevated and it was recommended that patient follow-up with her primary care provider.  She was also given some  medications and IV fluids while in the emergency room and reports a slight headache today but overall her migraines are better.  I believe that she needs to be on another preventative medication outside of Nurtec but will hold off for right now.  Patient wants to wait until after her lab work that was ordered by her primary care provider comes back.  Once the lab work comes back she is going to send me a message so that I can review the lab work and her follow-up appointment will be determined from that point. Patient had a MRI of brain on September 04, 2022 that showed no acute abnormality. The ER ordered a CTA of head and neck on 09/29/2022 that was normal   Patient Active Problem List  Diagnosis  . Migraine without status migrainosus, not intractable    Outpatient Encounter Medications as of 10/08/2022  Medication Sig Dispense Refill  . dapagliflozin  propanediol (FARXIGA ) 10 mg tab tablet Take 10 mg by mouth daily. (Patient not taking: Reported on 09/04/2022)    . Janumet  XR 50-500 mg TM24 Take 1 tablet by mouth in the morning and 1 tablet in the evening. Take with meals.    SABRA lisdexamfetamine (VYVANSE ) 60 mg capsule Take 1 capsule by mouth every morning.    . losartan -hydroCHLOROthiazide  (HYZAAR ) 100-25 mg per tablet Take 1 tablet by mouth every morning.    . meclizine  (ANTIVERT ) 25 mg tablet Take 25 mg by mouth.    . rimegepant 75 mg TbDL Take one tablet once a day prn migraine 8 tablet 4  . rosuvastatin  (CRESTOR ) 10 mg tablet Take 10 mg by mouth.    . spironolactone  (ALDACTONE ) 25 mg tablet Take 25 mg by mouth daily.    . topiramate  (TOPAMAX ) 50 mg tablet Take two tablets at night 180 tablet  2   No facility-administered encounter medications on file as of 10/08/2022.    Past Medical History:  Diagnosis Date  . Migraine     Past Surgical History:  Procedure Laterality Date  . THYROIDECTOMY     hemi  . TONSILLECTOMY    . TUBAL LIGATION    . WISDOM TOOTH EXTRACTION      No family history  on file.  Social History   Socioeconomic History  . Marital status: Divorced    Spouse name: Not on file  . Number of children: Not on file  . Years of education: Not on file  . Highest education level: Not on file  Occupational History  . Not on file  Tobacco Use  . Smoking status: Never  . Smokeless tobacco: Never  Vaping Use  . Vaping status: Never Used  Substance and Sexual Activity  . Alcohol use: Yes    Comment: rarely  . Drug use: Never  . Sexual activity: Not on file  Other Topics Concern  . Not on file  Social History Narrative  . Not on file   Social Determinants of Health   Food Insecurity: Not on file  Transportation Needs: Not on file  Safety: Not on file  Living Situation: Not on file    REVIEW OF SYSTEMS   CONSTITUTIONAL: No weight loss, fever, chills, weakness or fatigue.  HEENT: Eyes: No visual loss, blurred vision, double vision or yellow sclerae. Ears, Nose, Throat: No hearing loss, sneezing, congestion, runny nose or sore throat.  SKIN: No rash or itching.  CARDIOVASCULAR: No chest pain, chest pressure or chest discomfort. No palpitations or edema.  RESPIRATORY: No shortness of breath, cough or sputum.  GASTROINTESTINAL: No anorexia, nausea, vomiting or diarrhea. No abdominal pain or blood.  GENITOURINARY: Normal NEUROLOGICAL: No dizziness, syncope, paralysis, ataxia, numbness or tingling in the extremities. No change in bowel or bladder control. Headaches MUSCULOSKELETAL: No muscle, back pain, joint pain or stiffness.  HEMATOLOGIC: No anemia, bleeding or bruising.  LYMPHATICS: No enlarged nodes. No history of splenectomy.  PSYCHIATRIC: No history of depression or anxiety.  ENDOCRINOLOGIC: No reports of sweating, cold or heat intolerance. No polyuria or polydipsia.  ALLERGIES: No history of asthma, hives, eczema or rhinitis.   Impression:  Migraine headaches   Plan:  Patient will contact me once her labs come back from her primary care  provide.  Her follow-up appointment will be determined at that time.  Call or go to ER if any changes.  Safety and side effect discussed.   I have personally spent 20 minutes involved in face-to-face and non-face-to-face activities for this patient on the day of the visit. Professional time spent includes the following activities, counseling and coordination of care with regards to migraines, in addition to those noted in the documentation    Portions of this note were dictated using DRAGON voice recognition software. Please disregard any errors in transcription. This record has been created using Conservation officer, historic buildings. Errors have been sought and corrected, but may not always be located. Such creation errors do not reflect on the standard of medical care.

## 2022-10-09 ENCOUNTER — Other Ambulatory Visit: Payer: Self-pay | Admitting: Family Medicine

## 2022-10-09 DIAGNOSIS — N1832 Chronic kidney disease, stage 3b: Secondary | ICD-10-CM

## 2022-10-10 ENCOUNTER — Other Ambulatory Visit (HOSPITAL_COMMUNITY): Payer: Self-pay

## 2022-10-16 ENCOUNTER — Other Ambulatory Visit (HOSPITAL_COMMUNITY): Payer: Self-pay

## 2022-10-19 ENCOUNTER — Other Ambulatory Visit (HOSPITAL_COMMUNITY): Payer: Self-pay

## 2022-10-28 ENCOUNTER — Other Ambulatory Visit (HOSPITAL_COMMUNITY): Payer: Self-pay

## 2022-10-28 MED ORDER — ROSUVASTATIN CALCIUM 10 MG PO TABS
10.0000 mg | ORAL_TABLET | ORAL | 1 refills | Status: DC
  Filled 2022-10-28: qty 24, 84d supply, fill #0
  Filled 2023-02-22: qty 24, 84d supply, fill #1

## 2022-10-29 ENCOUNTER — Other Ambulatory Visit (HOSPITAL_COMMUNITY): Payer: Self-pay

## 2022-11-09 ENCOUNTER — Other Ambulatory Visit (HOSPITAL_COMMUNITY): Payer: Self-pay

## 2022-11-09 ENCOUNTER — Ambulatory Visit
Admission: RE | Admit: 2022-11-09 | Discharge: 2022-11-09 | Disposition: A | Discharge status: Home / Self Care | Payer: Commercial Managed Care - PPO | Source: Ambulatory Visit | Attending: Family Medicine

## 2022-11-09 DIAGNOSIS — N1832 Chronic kidney disease, stage 3b: Secondary | ICD-10-CM

## 2022-11-09 DIAGNOSIS — N189 Chronic kidney disease, unspecified: Secondary | ICD-10-CM | POA: Diagnosis not present

## 2022-11-10 ENCOUNTER — Other Ambulatory Visit (HOSPITAL_COMMUNITY): Payer: Self-pay

## 2022-11-10 MED ORDER — LISDEXAMFETAMINE DIMESYLATE 60 MG PO CAPS
60.0000 mg | ORAL_CAPSULE | Freq: Every morning | ORAL | 0 refills | Status: DC
  Filled 2022-11-10: qty 90, 90d supply, fill #0

## 2022-11-12 ENCOUNTER — Other Ambulatory Visit (HOSPITAL_COMMUNITY): Payer: Self-pay

## 2022-11-13 ENCOUNTER — Other Ambulatory Visit (HOSPITAL_COMMUNITY): Payer: Self-pay

## 2022-11-16 ENCOUNTER — Other Ambulatory Visit (HOSPITAL_COMMUNITY): Payer: Self-pay

## 2022-11-17 ENCOUNTER — Other Ambulatory Visit (HOSPITAL_COMMUNITY): Payer: Self-pay

## 2022-11-17 ENCOUNTER — Other Ambulatory Visit: Payer: Self-pay

## 2022-11-20 ENCOUNTER — Other Ambulatory Visit (HOSPITAL_COMMUNITY): Payer: Self-pay

## 2022-12-15 ENCOUNTER — Other Ambulatory Visit (HOSPITAL_COMMUNITY): Payer: Self-pay

## 2023-01-01 ENCOUNTER — Other Ambulatory Visit (HOSPITAL_COMMUNITY): Payer: Self-pay

## 2023-01-01 DIAGNOSIS — Z9889 Other specified postprocedural states: Secondary | ICD-10-CM | POA: Diagnosis not present

## 2023-01-01 DIAGNOSIS — Z1231 Encounter for screening mammogram for malignant neoplasm of breast: Secondary | ICD-10-CM | POA: Diagnosis not present

## 2023-01-01 DIAGNOSIS — E1122 Type 2 diabetes mellitus with diabetic chronic kidney disease: Secondary | ICD-10-CM | POA: Diagnosis not present

## 2023-01-01 DIAGNOSIS — R87610 Atypical squamous cells of undetermined significance on cytologic smear of cervix (ASC-US): Secondary | ICD-10-CM | POA: Diagnosis not present

## 2023-01-01 DIAGNOSIS — I10 Essential (primary) hypertension: Secondary | ICD-10-CM | POA: Diagnosis not present

## 2023-01-01 DIAGNOSIS — E7800 Pure hypercholesterolemia, unspecified: Secondary | ICD-10-CM | POA: Diagnosis not present

## 2023-01-01 DIAGNOSIS — F900 Attention-deficit hyperactivity disorder, predominantly inattentive type: Secondary | ICD-10-CM | POA: Diagnosis not present

## 2023-01-01 DIAGNOSIS — Z0000 Encounter for general adult medical examination without abnormal findings: Secondary | ICD-10-CM | POA: Diagnosis not present

## 2023-01-01 DIAGNOSIS — E538 Deficiency of other specified B group vitamins: Secondary | ICD-10-CM | POA: Diagnosis not present

## 2023-01-01 DIAGNOSIS — N1832 Chronic kidney disease, stage 3b: Secondary | ICD-10-CM | POA: Diagnosis not present

## 2023-01-01 MED ORDER — DAPAGLIFLOZIN PROPANEDIOL 10 MG PO TABS
10.0000 mg | ORAL_TABLET | Freq: Every day | ORAL | 0 refills | Status: DC
  Filled 2023-01-01: qty 90, 90d supply, fill #0

## 2023-01-02 ENCOUNTER — Other Ambulatory Visit (HOSPITAL_COMMUNITY): Payer: Self-pay

## 2023-01-02 MED ORDER — JANUMET XR 50-500 MG PO TB24
1.0000 | ORAL_TABLET | Freq: Two times a day (BID) | ORAL | 3 refills | Status: AC
Start: 2023-01-02 — End: ?
  Filled 2023-01-02: qty 60, 30d supply, fill #0
  Filled 2023-02-22: qty 60, 30d supply, fill #1
  Filled 2023-04-13: qty 60, 30d supply, fill #2
  Filled 2023-06-23: qty 60, 30d supply, fill #3
  Filled 2023-08-04 – 2023-09-27 (×3): qty 60, 30d supply, fill #4
  Filled 2023-11-23 – 2023-12-10 (×2): qty 60, 30d supply, fill #5

## 2023-01-02 MED ORDER — LOSARTAN POTASSIUM-HCTZ 100-25 MG PO TABS
1.0000 | ORAL_TABLET | Freq: Every morning | ORAL | 3 refills | Status: DC
  Filled 2023-01-02 – 2023-02-22 (×2): qty 90, 90d supply, fill #0
  Filled 2023-06-30 (×2): qty 90, 90d supply, fill #1
  Filled 2023-09-27: qty 90, 90d supply, fill #2

## 2023-01-02 MED ORDER — ROSUVASTATIN CALCIUM 10 MG PO TABS
10.0000 mg | ORAL_TABLET | ORAL | 1 refills | Status: DC
  Filled 2023-01-02 – 2023-06-23 (×2): qty 24, 84d supply, fill #0
  Filled 2023-08-04 – 2023-09-27 (×3): qty 24, 84d supply, fill #1

## 2023-01-02 MED ORDER — LISDEXAMFETAMINE DIMESYLATE 60 MG PO CAPS
60.0000 mg | ORAL_CAPSULE | Freq: Every morning | ORAL | 0 refills | Status: DC
  Filled 2023-01-02 – 2023-02-22 (×2): qty 30, 30d supply, fill #0

## 2023-01-02 MED ORDER — SPIRONOLACTONE 25 MG PO TABS
25.0000 mg | ORAL_TABLET | Freq: Every day | ORAL | 1 refills | Status: DC
  Filled 2023-01-02: qty 90, 90d supply, fill #0
  Filled 2023-05-28: qty 90, 90d supply, fill #1

## 2023-01-04 ENCOUNTER — Other Ambulatory Visit (HOSPITAL_COMMUNITY): Payer: Self-pay

## 2023-01-04 ENCOUNTER — Other Ambulatory Visit (HOSPITAL_BASED_OUTPATIENT_CLINIC_OR_DEPARTMENT_OTHER): Payer: Self-pay | Admitting: Family Medicine

## 2023-01-04 ENCOUNTER — Other Ambulatory Visit: Payer: Self-pay

## 2023-01-04 DIAGNOSIS — Z1231 Encounter for screening mammogram for malignant neoplasm of breast: Secondary | ICD-10-CM

## 2023-01-06 ENCOUNTER — Other Ambulatory Visit (HOSPITAL_COMMUNITY): Payer: Self-pay

## 2023-01-12 ENCOUNTER — Telehealth (HOSPITAL_BASED_OUTPATIENT_CLINIC_OR_DEPARTMENT_OTHER): Payer: Self-pay

## 2023-01-25 DIAGNOSIS — S99922A Unspecified injury of left foot, initial encounter: Secondary | ICD-10-CM | POA: Diagnosis not present

## 2023-02-03 DIAGNOSIS — I129 Hypertensive chronic kidney disease with stage 1 through stage 4 chronic kidney disease, or unspecified chronic kidney disease: Secondary | ICD-10-CM | POA: Diagnosis not present

## 2023-02-03 DIAGNOSIS — D631 Anemia in chronic kidney disease: Secondary | ICD-10-CM | POA: Diagnosis not present

## 2023-02-03 DIAGNOSIS — N189 Chronic kidney disease, unspecified: Secondary | ICD-10-CM | POA: Diagnosis not present

## 2023-02-03 DIAGNOSIS — N1832 Chronic kidney disease, stage 3b: Secondary | ICD-10-CM | POA: Diagnosis not present

## 2023-02-03 DIAGNOSIS — E1122 Type 2 diabetes mellitus with diabetic chronic kidney disease: Secondary | ICD-10-CM | POA: Diagnosis not present

## 2023-02-22 ENCOUNTER — Other Ambulatory Visit (HOSPITAL_COMMUNITY): Payer: Self-pay

## 2023-03-02 ENCOUNTER — Other Ambulatory Visit (HOSPITAL_COMMUNITY): Payer: Self-pay

## 2023-03-05 ENCOUNTER — Other Ambulatory Visit (HOSPITAL_COMMUNITY): Payer: Self-pay

## 2023-03-16 ENCOUNTER — Other Ambulatory Visit (HOSPITAL_COMMUNITY): Payer: Self-pay

## 2023-03-16 DIAGNOSIS — E7800 Pure hypercholesterolemia, unspecified: Secondary | ICD-10-CM | POA: Diagnosis not present

## 2023-03-16 DIAGNOSIS — Z23 Encounter for immunization: Secondary | ICD-10-CM | POA: Diagnosis not present

## 2023-03-16 DIAGNOSIS — E538 Deficiency of other specified B group vitamins: Secondary | ICD-10-CM | POA: Diagnosis not present

## 2023-03-16 DIAGNOSIS — E1165 Type 2 diabetes mellitus with hyperglycemia: Secondary | ICD-10-CM | POA: Diagnosis not present

## 2023-03-16 DIAGNOSIS — N1832 Chronic kidney disease, stage 3b: Secondary | ICD-10-CM | POA: Diagnosis not present

## 2023-03-16 DIAGNOSIS — I10 Essential (primary) hypertension: Secondary | ICD-10-CM | POA: Diagnosis not present

## 2023-03-16 DIAGNOSIS — F900 Attention-deficit hyperactivity disorder, predominantly inattentive type: Secondary | ICD-10-CM | POA: Diagnosis not present

## 2023-03-16 MED ORDER — LISDEXAMFETAMINE DIMESYLATE 60 MG PO CAPS
60.0000 mg | ORAL_CAPSULE | Freq: Every morning | ORAL | 0 refills | Status: DC
  Filled 2023-03-16 – 2023-04-13 (×2): qty 90, 90d supply, fill #0

## 2023-03-23 ENCOUNTER — Other Ambulatory Visit (HOSPITAL_COMMUNITY): Payer: Self-pay

## 2023-03-23 MED ORDER — GLIPIZIDE ER 5 MG PO TB24
5.0000 mg | ORAL_TABLET | Freq: Every day | ORAL | 1 refills | Status: DC
  Filled 2023-03-23 – 2023-04-13 (×2): qty 90, 90d supply, fill #0
  Filled 2023-08-04: qty 90, 90d supply, fill #1

## 2023-04-02 ENCOUNTER — Other Ambulatory Visit (HOSPITAL_COMMUNITY): Payer: Self-pay

## 2023-04-13 ENCOUNTER — Other Ambulatory Visit (HOSPITAL_COMMUNITY): Payer: Self-pay

## 2023-04-14 ENCOUNTER — Other Ambulatory Visit (HOSPITAL_COMMUNITY): Payer: Self-pay

## 2023-04-14 ENCOUNTER — Other Ambulatory Visit: Payer: Self-pay

## 2023-04-26 ENCOUNTER — Other Ambulatory Visit (HOSPITAL_COMMUNITY): Payer: Self-pay

## 2023-04-28 ENCOUNTER — Other Ambulatory Visit: Payer: Self-pay

## 2023-04-28 ENCOUNTER — Other Ambulatory Visit (HOSPITAL_COMMUNITY): Payer: Self-pay

## 2023-04-28 MED ORDER — DAPAGLIFLOZIN PROPANEDIOL 10 MG PO TABS
10.0000 mg | ORAL_TABLET | Freq: Every day | ORAL | 0 refills | Status: DC
  Filled 2023-04-28: qty 90, 90d supply, fill #0

## 2023-05-11 DIAGNOSIS — N1832 Chronic kidney disease, stage 3b: Secondary | ICD-10-CM | POA: Diagnosis not present

## 2023-05-11 DIAGNOSIS — I129 Hypertensive chronic kidney disease with stage 1 through stage 4 chronic kidney disease, or unspecified chronic kidney disease: Secondary | ICD-10-CM | POA: Diagnosis not present

## 2023-05-11 DIAGNOSIS — E1122 Type 2 diabetes mellitus with diabetic chronic kidney disease: Secondary | ICD-10-CM | POA: Diagnosis not present

## 2023-05-24 ENCOUNTER — Other Ambulatory Visit (HOSPITAL_COMMUNITY): Payer: Self-pay

## 2023-05-28 ENCOUNTER — Other Ambulatory Visit (HOSPITAL_COMMUNITY): Payer: Self-pay

## 2023-06-23 ENCOUNTER — Other Ambulatory Visit (HOSPITAL_COMMUNITY): Payer: Self-pay

## 2023-06-24 ENCOUNTER — Other Ambulatory Visit (HOSPITAL_COMMUNITY): Payer: Self-pay

## 2023-06-24 ENCOUNTER — Other Ambulatory Visit: Payer: Self-pay

## 2023-06-25 ENCOUNTER — Other Ambulatory Visit (HOSPITAL_COMMUNITY): Payer: Self-pay

## 2023-06-30 ENCOUNTER — Other Ambulatory Visit (HOSPITAL_COMMUNITY): Payer: Self-pay

## 2023-07-07 DIAGNOSIS — F900 Attention-deficit hyperactivity disorder, predominantly inattentive type: Secondary | ICD-10-CM | POA: Diagnosis not present

## 2023-07-07 DIAGNOSIS — I10 Essential (primary) hypertension: Secondary | ICD-10-CM | POA: Diagnosis not present

## 2023-07-07 DIAGNOSIS — E7800 Pure hypercholesterolemia, unspecified: Secondary | ICD-10-CM | POA: Diagnosis not present

## 2023-07-07 DIAGNOSIS — N1832 Chronic kidney disease, stage 3b: Secondary | ICD-10-CM | POA: Diagnosis not present

## 2023-07-07 DIAGNOSIS — E1169 Type 2 diabetes mellitus with other specified complication: Secondary | ICD-10-CM | POA: Diagnosis not present

## 2023-07-22 ENCOUNTER — Other Ambulatory Visit (HOSPITAL_COMMUNITY): Payer: Self-pay

## 2023-07-22 DIAGNOSIS — G43909 Migraine, unspecified, not intractable, without status migrainosus: Secondary | ICD-10-CM | POA: Diagnosis not present

## 2023-07-22 MED ORDER — NURTEC 75 MG PO TBDP
75.0000 mg | ORAL_TABLET | Freq: Every day | ORAL | 7 refills | Status: AC | PRN
  Filled 2023-07-22: qty 8, 16d supply, fill #0

## 2023-07-22 NOTE — Progress Notes (Signed)
 [address] - AMBULATORY [facility]  - NEUROLOGY [place] [address] [place] [address]   Date of Visit: 07/22/2023 Patient Name: Tara House Patient ID: [id number] Patient Primary Care:No primary care provider on file.  Chief complaint: Patient presents for Migraine (Patient presents today for migraine follow up. Patient states she is off of Topiramate  and Nurtec is helping her so far. Patient states she has been getting migraine every other day due to the weather. )     Data Reviewed   HPI:  58 year old female patient presents for follow-up appointment regarding migraine headaches.  Patient reports that her migraines are stable.  Patient is currently being prescribed Nurtec as needed.  She was taking Topamax  but reports she stopped the medication due to kidney stones.  She feels that Nurtec is helping to control her migraines.  States typically the barometric pressure makes her migraines worse.  I am going to refill medication and have her follow-up in 6 months by video visit or sooner if needed.  Patient Active Problem List  Diagnosis  . Migraine without status migrainosus, not intractable    Outpatient Encounter Medications as of 07/22/2023  Medication Sig Dispense Refill  . dapagliflozin  propanediol (FARXIGA ) 10 mg tab tablet Take 10 mg by mouth daily.    . glipiZIDE  (GLUCOTROL  XL) 5 mg 24 hr tablet Take 5 mg by mouth.    . Janumet  XR 50-500 mg TM24 Take 1 tablet by mouth in the morning and 1 tablet in the evening. Take with meals.    SABRA lisdexamfetamine (VYVANSE ) 60 mg capsule Take 1 capsule by mouth every morning.    . losartan -hydroCHLOROthiazide  (HYZAAR ) 100-25 mg per tablet Take 1 tablet by mouth every morning.    . meclizine  (ANTIVERT ) 25 mg tablet Take 25 mg by mouth.    . rimegepant 75 mg TbDL Take one tablet once a day prn migraine 8 tablet 4  . rosuvastatin  (CRESTOR ) 10 mg tablet Take 10 mg by mouth.     . spironolactone  (ALDACTONE ) 25 mg tablet Take 25 mg by mouth daily.    . topiramate  (TOPAMAX ) 50 mg tablet Take two tablets at night (Patient not taking: Reported on 07/22/2023) 180 tablet 2   No facility-administered encounter medications on file as of 07/22/2023.    Past Medical History:  Diagnosis Date  . Migraine     Past Surgical History:  Procedure Laterality Date  . THYROIDECTOMY     hemi  . TONSILLECTOMY    . TUBAL LIGATION    . WISDOM TOOTH EXTRACTION      No family history on file.  Social History   Socioeconomic History  . Marital status: Divorced    Spouse name: Not on file  . Number of children: Not on file  . Years of education: Not on file  . Highest education level: Not on file  Occupational History  . Not on file  Tobacco Use  . Smoking status: Never  . Smokeless tobacco: Never  Vaping Use  . Vaping status: Never Used  Substance and Sexual Activity  . Alcohol use: Yes    Comment: rarely  . Drug use: Never  . Sexual activity: Not on file  Other Topics Concern  . Not on file  Social History Narrative  . Not on file   Social Drivers of Health   Food Insecurity: Not on file  Transportation Needs: Not on file  Safety: High Risk (07/22/2023)   Safety   .  How often does anyone, including family and friends, physically hurt you?: Never   . How often does anyone, including family and friends, insult or talk down to you?: Rarely   . How often does anyone, including family and friends, threaten you with harm?: Never   . How often does anyone, including family and friends, scream or curse at you?: Rarely  Living Situation: Not on file    REVIEW OF SYSTEMS   CONSTITUTIONAL: No weight loss, fever, chills, weakness or fatigue.  HEENT: Eyes: No visual loss, blurred vision, double vision or yellow sclerae. Ears, Nose, Throat: No hearing loss, sneezing, congestion, runny nose or sore throat.  SKIN: No rash or itching.  CARDIOVASCULAR: No chest pain,  chest pressure or chest discomfort. No palpitations or edema.  RESPIRATORY: No shortness of breath, cough or sputum.  GASTROINTESTINAL: No anorexia, nausea, vomiting or diarrhea. No abdominal pain or blood.  GENITOURINARY: Normal NEUROLOGICAL: No dizziness, syncope, paralysis, ataxia, numbness or tingling in the extremities. No change in bowel or bladder control. Migraine headaches MUSCULOSKELETAL: No muscle, back pain, joint pain or stiffness.  HEMATOLOGIC: No anemia, bleeding or bruising.  LYMPHATICS: No enlarged nodes. No history of splenectomy.  PSYCHIATRIC: No history of depression or anxiety.  ENDOCRINOLOGIC: No reports of sweating, cold or heat intolerance. No polyuria or polydipsia.  ALLERGIES: No history of asthma, hives, eczema or rhinitis.     PHYSICAL EXAM:   GEN: Appears to be in no apparent distress HEENT: Normocephalic atraumatic, no abnormal bruising NECK: No carotid bruits CARDIAC: Regular rhythm  and rate.  No rubs, murmurs or gallops. LUNGS: Full expansion.  No wheezes or rhonchi. ABDOMEN: Nontender, nondistended SKIN: Normal  NEURO: The patient is alert.  Normal speech and language. Thought patterns are normal. Negative for tremors.  MUSCULOSKELETAL: Gait stable CRANIAL NERVES:Vision, visual fields are full.  Normal facial Symmetry. Uvula and palate upward bilaterally. Tongue midline. MOTOR: Normal tone and bulk.  No abnormal movements. REFLEXES: Toes bilaterally down.   Impression:  Migraine headaches   Plan:  Please see HPI  Call or go to ER if any changes.  Safety and side effect discussed.   I have personally spent 20 minutes involved in face-to-face and non-face-to-face activities for this patient on the day of the visit. Professional time spent includes the following activities, counseling and coordination of care with regards to migraines, in addition to those noted in the documentation    Portions of this note were dictated using DRAGON  voice recognition software. Please disregard any errors in transcription. This record has been created using Conservation officer, historic buildings. Errors have been sought and corrected, but may not always be located. Such creation errors do not reflect on the standard of medical care.

## 2023-07-27 ENCOUNTER — Other Ambulatory Visit (HOSPITAL_COMMUNITY): Payer: Self-pay

## 2023-07-27 DIAGNOSIS — R42 Dizziness and giddiness: Secondary | ICD-10-CM | POA: Diagnosis not present

## 2023-07-27 DIAGNOSIS — J019 Acute sinusitis, unspecified: Secondary | ICD-10-CM | POA: Diagnosis not present

## 2023-07-27 MED ORDER — AMOXICILLIN-POT CLAVULANATE 875-125 MG PO TABS
1.0000 | ORAL_TABLET | Freq: Two times a day (BID) | ORAL | 0 refills | Status: DC
  Filled 2023-07-27: qty 20, 10d supply, fill #0

## 2023-07-27 MED ORDER — MECLIZINE HCL 25 MG PO TABS
25.0000 mg | ORAL_TABLET | Freq: Three times a day (TID) | ORAL | 0 refills | Status: AC
  Filled 2023-07-27: qty 21, 7d supply, fill #0

## 2023-07-29 ENCOUNTER — Other Ambulatory Visit (HOSPITAL_COMMUNITY): Payer: Self-pay

## 2023-07-29 MED ORDER — LISDEXAMFETAMINE DIMESYLATE 60 MG PO CAPS
60.0000 mg | ORAL_CAPSULE | Freq: Every morning | ORAL | 0 refills | Status: AC
  Filled 2023-07-29: qty 90, 90d supply, fill #0

## 2023-07-29 MED ORDER — SPIRONOLACTONE 25 MG PO TABS
25.0000 mg | ORAL_TABLET | Freq: Every day | ORAL | 1 refills | Status: AC
  Filled 2023-07-29 – 2023-09-27 (×2): qty 90, 90d supply, fill #0

## 2023-08-04 ENCOUNTER — Other Ambulatory Visit (HOSPITAL_COMMUNITY): Payer: Self-pay

## 2023-08-05 ENCOUNTER — Other Ambulatory Visit (HOSPITAL_COMMUNITY): Payer: Self-pay

## 2023-08-05 MED ORDER — DAPAGLIFLOZIN PROPANEDIOL 10 MG PO TABS
10.0000 mg | ORAL_TABLET | Freq: Every day | ORAL | 0 refills | Status: DC
  Filled 2023-08-05: qty 90, 90d supply, fill #0

## 2023-08-12 ENCOUNTER — Other Ambulatory Visit (HOSPITAL_COMMUNITY): Payer: Self-pay

## 2023-08-13 ENCOUNTER — Other Ambulatory Visit (HOSPITAL_COMMUNITY): Payer: Self-pay

## 2023-08-19 ENCOUNTER — Other Ambulatory Visit (HOSPITAL_COMMUNITY): Payer: Self-pay

## 2023-08-23 ENCOUNTER — Other Ambulatory Visit (HOSPITAL_COMMUNITY): Payer: Self-pay

## 2023-08-23 DIAGNOSIS — M62830 Muscle spasm of back: Secondary | ICD-10-CM | POA: Diagnosis not present

## 2023-08-23 DIAGNOSIS — M5442 Lumbago with sciatica, left side: Secondary | ICD-10-CM | POA: Diagnosis not present

## 2023-08-23 MED ORDER — CYCLOBENZAPRINE HCL 10 MG PO TABS
10.0000 mg | ORAL_TABLET | Freq: Three times a day (TID) | ORAL | 0 refills | Status: AC
  Filled 2023-08-23: qty 30, 10d supply, fill #0

## 2023-08-23 MED ORDER — PREDNISONE 20 MG PO TABS
ORAL_TABLET | ORAL | 0 refills | Status: DC
  Filled 2023-08-23: qty 20, 12d supply, fill #0

## 2023-08-23 MED ORDER — OXYCODONE-ACETAMINOPHEN 5-325 MG PO TABS
1.0000 | ORAL_TABLET | Freq: Three times a day (TID) | ORAL | 0 refills | Status: DC | PRN
Start: 2023-08-23 — End: 2023-11-24
  Filled 2023-08-23: qty 9, 3d supply, fill #0

## 2023-08-24 ENCOUNTER — Other Ambulatory Visit (HOSPITAL_COMMUNITY): Payer: Self-pay

## 2023-08-24 DIAGNOSIS — M545 Low back pain, unspecified: Secondary | ICD-10-CM | POA: Diagnosis not present

## 2023-08-24 MED ORDER — GABAPENTIN 300 MG PO CAPS
300.0000 mg | ORAL_CAPSULE | Freq: Two times a day (BID) | ORAL | 1 refills | Status: AC
  Filled 2023-08-24 (×2): qty 60, 30d supply, fill #0
  Filled 2023-11-23: qty 60, 30d supply, fill #1

## 2023-08-26 ENCOUNTER — Other Ambulatory Visit (HOSPITAL_COMMUNITY): Payer: Self-pay

## 2023-08-26 MED ORDER — PREDNISONE 20 MG PO TABS
ORAL_TABLET | ORAL | 0 refills | Status: DC
  Filled 2023-08-26 (×2): qty 20, 12d supply, fill #0

## 2023-09-01 DIAGNOSIS — M545 Low back pain, unspecified: Secondary | ICD-10-CM | POA: Diagnosis not present

## 2023-09-06 ENCOUNTER — Other Ambulatory Visit (HOSPITAL_COMMUNITY): Payer: Self-pay

## 2023-09-06 DIAGNOSIS — M545 Low back pain, unspecified: Secondary | ICD-10-CM | POA: Diagnosis not present

## 2023-09-06 MED ORDER — MELOXICAM 15 MG PO TABS
15.0000 mg | ORAL_TABLET | Freq: Every day | ORAL | 1 refills | Status: DC | PRN
  Filled 2023-09-06: qty 30, 30d supply, fill #0

## 2023-09-07 DIAGNOSIS — M4726 Other spondylosis with radiculopathy, lumbar region: Secondary | ICD-10-CM | POA: Diagnosis not present

## 2023-09-09 DIAGNOSIS — M4726 Other spondylosis with radiculopathy, lumbar region: Secondary | ICD-10-CM | POA: Diagnosis not present

## 2023-09-14 ENCOUNTER — Other Ambulatory Visit (HOSPITAL_COMMUNITY): Payer: Self-pay

## 2023-09-14 ENCOUNTER — Telehealth: Payer: Self-pay | Admitting: Pharmacy Technician

## 2023-09-14 DIAGNOSIS — M4726 Other spondylosis with radiculopathy, lumbar region: Secondary | ICD-10-CM | POA: Diagnosis not present

## 2023-09-14 NOTE — Telephone Encounter (Signed)
 Pharmacy Patient Advocate Encounter   Received notification from CoverMyMeds that prior authorization for NURTEC 75MG  is required/requested.   Insurance verification completed.   The patient is insured through [facility] .   Per test claim: PA required; PA submitted to above mentioned insurance via CoverMyMeds Key/confirmation #/EOC [id number] Status is pending

## 2023-09-14 NOTE — Telephone Encounter (Signed)
 Pharmacy Patient Advocate Encounter  Received notification from [employer] that Prior Authorization for NURTEC 75MG  has been APPROVED from 7.15.25 to 7.15.26. Ran test claim, Copay is $0. This test claim was processed through [facility]- copay amounts may vary at other pharmacies due to pharmacy/plan contracts, or as the patient moves through the different stages of their insurance plan.   PA #/Case ID/Reference #: [id number]

## 2023-09-17 DIAGNOSIS — M4726 Other spondylosis with radiculopathy, lumbar region: Secondary | ICD-10-CM | POA: Diagnosis not present

## 2023-09-20 DIAGNOSIS — E1122 Type 2 diabetes mellitus with diabetic chronic kidney disease: Secondary | ICD-10-CM | POA: Diagnosis not present

## 2023-09-20 DIAGNOSIS — I129 Hypertensive chronic kidney disease with stage 1 through stage 4 chronic kidney disease, or unspecified chronic kidney disease: Secondary | ICD-10-CM | POA: Diagnosis not present

## 2023-09-20 DIAGNOSIS — N1832 Chronic kidney disease, stage 3b: Secondary | ICD-10-CM | POA: Diagnosis not present

## 2023-09-20 DIAGNOSIS — N2889 Other specified disorders of kidney and ureter: Secondary | ICD-10-CM | POA: Diagnosis not present

## 2023-09-21 DIAGNOSIS — M4726 Other spondylosis with radiculopathy, lumbar region: Secondary | ICD-10-CM | POA: Diagnosis not present

## 2023-09-24 DIAGNOSIS — M4726 Other spondylosis with radiculopathy, lumbar region: Secondary | ICD-10-CM | POA: Diagnosis not present

## 2023-09-27 ENCOUNTER — Other Ambulatory Visit (HOSPITAL_BASED_OUTPATIENT_CLINIC_OR_DEPARTMENT_OTHER): Payer: Self-pay

## 2023-09-27 ENCOUNTER — Other Ambulatory Visit: Payer: Self-pay

## 2023-09-27 ENCOUNTER — Other Ambulatory Visit (HOSPITAL_COMMUNITY): Payer: Self-pay

## 2023-09-27 DIAGNOSIS — M5442 Lumbago with sciatica, left side: Secondary | ICD-10-CM | POA: Diagnosis not present

## 2023-09-27 DIAGNOSIS — M4726 Other spondylosis with radiculopathy, lumbar region: Secondary | ICD-10-CM | POA: Diagnosis not present

## 2023-09-30 DIAGNOSIS — M4726 Other spondylosis with radiculopathy, lumbar region: Secondary | ICD-10-CM | POA: Diagnosis not present

## 2023-10-01 ENCOUNTER — Other Ambulatory Visit (HOSPITAL_COMMUNITY): Payer: Self-pay | Admitting: Nephrology

## 2023-10-01 DIAGNOSIS — N1832 Chronic kidney disease, stage 3b: Secondary | ICD-10-CM

## 2023-10-05 ENCOUNTER — Ambulatory Visit (HOSPITAL_COMMUNITY)
Admission: RE | Admit: 2023-10-05 | Discharge: 2023-10-05 | Disposition: A | Discharge status: Home / Self Care | Source: Ambulatory Visit | Attending: Nephrology | Admitting: Nephrology

## 2023-10-05 DIAGNOSIS — N189 Chronic kidney disease, unspecified: Secondary | ICD-10-CM | POA: Diagnosis not present

## 2023-10-05 DIAGNOSIS — N1832 Chronic kidney disease, stage 3b: Secondary | ICD-10-CM | POA: Diagnosis not present

## 2023-10-05 DIAGNOSIS — K760 Fatty (change of) liver, not elsewhere classified: Secondary | ICD-10-CM | POA: Diagnosis not present

## 2023-10-05 DIAGNOSIS — N281 Cyst of kidney, acquired: Secondary | ICD-10-CM | POA: Diagnosis not present

## 2023-10-05 MED ORDER — SODIUM CHLORIDE (PF) 0.9 % IJ SOLN
INTRAMUSCULAR | Status: AC
  Filled 2023-10-05: qty 50

## 2023-10-05 MED ORDER — IOHEXOL 300 MG/ML  SOLN
100.0000 mL | Freq: Once | INTRAMUSCULAR | Status: AC | PRN
  Administered 2023-10-05: 100 mL via INTRAVENOUS

## 2023-10-06 DIAGNOSIS — M545 Low back pain, unspecified: Secondary | ICD-10-CM | POA: Diagnosis not present

## 2023-10-06 DIAGNOSIS — D49512 Neoplasm of unspecified behavior of left kidney: Secondary | ICD-10-CM | POA: Diagnosis not present

## 2023-10-14 ENCOUNTER — Other Ambulatory Visit: Payer: Self-pay | Admitting: Urology

## 2023-10-15 DIAGNOSIS — D49512 Neoplasm of unspecified behavior of left kidney: Secondary | ICD-10-CM | POA: Diagnosis not present

## 2023-10-15 DIAGNOSIS — R918 Other nonspecific abnormal finding of lung field: Secondary | ICD-10-CM | POA: Diagnosis not present

## 2023-10-19 DIAGNOSIS — Z01818 Encounter for other preprocedural examination: Secondary | ICD-10-CM | POA: Diagnosis not present

## 2023-10-19 DIAGNOSIS — Z23 Encounter for immunization: Secondary | ICD-10-CM | POA: Diagnosis not present

## 2023-10-19 DIAGNOSIS — N2889 Other specified disorders of kidney and ureter: Secondary | ICD-10-CM | POA: Diagnosis not present

## 2023-11-23 ENCOUNTER — Other Ambulatory Visit (HOSPITAL_COMMUNITY): Payer: Self-pay

## 2023-11-23 DIAGNOSIS — R8271 Bacteriuria: Secondary | ICD-10-CM | POA: Diagnosis not present

## 2023-11-23 DIAGNOSIS — D49512 Neoplasm of unspecified behavior of left kidney: Secondary | ICD-10-CM | POA: Diagnosis not present

## 2023-11-24 ENCOUNTER — Other Ambulatory Visit: Payer: Self-pay

## 2023-11-24 ENCOUNTER — Other Ambulatory Visit (HOSPITAL_COMMUNITY): Payer: Self-pay

## 2023-11-24 MED ORDER — DAPAGLIFLOZIN PROPANEDIOL 10 MG PO TABS
10.0000 mg | ORAL_TABLET | Freq: Every day | ORAL | 0 refills | Status: DC
  Filled 2023-11-24: qty 90, 90d supply, fill #0

## 2023-11-24 MED ORDER — GLIPIZIDE ER 5 MG PO TB24
5.0000 mg | ORAL_TABLET | Freq: Every day | ORAL | 0 refills | Status: AC
  Filled 2023-11-24: qty 90, 90d supply, fill #0

## 2023-11-24 MED ORDER — LISDEXAMFETAMINE DIMESYLATE 60 MG PO CAPS
60.0000 mg | ORAL_CAPSULE | Freq: Every morning | ORAL | 0 refills | Status: AC
  Filled 2023-11-24: qty 70, 70d supply, fill #0
  Filled 2023-11-24: qty 20, 20d supply, fill #0

## 2023-11-24 NOTE — Patient Instructions (Signed)
 SURGICAL WAITING ROOM VISITATION  Patients having surgery or a procedure may have no more than 2 support people in the waiting area - these visitors may rotate.    Children under the age of 73 must have an adult with them who is not the patient.  Visitors with respiratory illnesses are discouraged from visiting and should remain at home.  If the patient needs to stay at the hospital during part of their recovery, the visitor guidelines for inpatient rooms apply. Pre-op nurse will coordinate an appropriate time for 1 support person to accompany patient in pre-op.  This support person may not rotate.    Please refer to the [facility] website for the visitor guidelines for Inpatients (after your surgery is over and you are in a regular room).       Your procedure is scheduled on:  11/30/2023    Report to [facility] Main Entrance    Report to admitting at   [phone]   Call this number if you have problems the morning of surgery [phone]   Do not eat food  or drink liquids :After Midnight.               If you have questions, please contact your surgeon's office.      Oral Hygiene is also important to reduce your risk of infection.                                    Remember - BRUSH YOUR TEETH THE MORNING OF SURGERY WITH YOUR REGULAR TOOTHPASTE  DENTURES WILL BE REMOVED PRIOR TO SURGERY PLEASE DO NOT APPLY Poly grip OR ADHESIVES!!!   Do NOT smoke after Midnight   Stop all vitamins and herbal supplements 7 days before surgery.                Take these medicines the morning of surgery with A SIP OF WATER: gabapentin             Farxiga - last dose on 11/26/23             Glipizide - none nite before and none am of surgery             Janumet  XR   DO NOT TAKE ANY ORAL DIABETIC MEDICATIONS DAY OF YOUR SURGERY  Bring CPAP mask and tubing day of surgery.                              You may not have any metal on your body including hair pins, jewelry, and body  piercing             Do not wear make-up, lotions, powders, perfumes/cologne, or deodorant  Do not wear nail polish including gel and S&S, artificial/acrylic nails, or any other type of covering on natural nails including finger and toenails. If you have artificial nails, gel coating, etc. that needs to be removed by a nail salon please have this removed prior to surgery or surgery may need to be canceled/ delayed if the surgeon/ anesthesia feels like they are unable to be safely monitored.   Do not shave  48 hours prior to surgery.               Men may shave face and neck.   Do not bring valuables to the hospital. [facility] IS NOT  RESPONSIBLE   FOR VALUABLES.   Contacts, glasses, dentures or bridgework may not be worn into surgery.   Bring small overnight bag day of surgery.   DO NOT BRING YOUR HOME MEDICATIONS TO THE HOSPITAL. PHARMACY WILL DISPENSE MEDICATIONS LISTED ON YOUR MEDICATION LIST TO YOU DURING YOUR ADMISSION IN THE HOSPITAL!    Patients discharged on the day of surgery will not be allowed to drive home.  Someone NEEDS to stay with you for the first 24 hours after anesthesia.   Special Instructions: Bring a copy of your healthcare power of attorney and living will documents the day of surgery if you haven't scanned them before.              Please read over the following fact sheets you were given: IF YOU HAVE QUESTIONS ABOUT YOUR PRE-OP INSTRUCTIONS PLEASE CALL [phone].   If you received a COVID test during your pre-op visit  it is requested that you wear a mask when out in public, stay away from anyone that may not be feeling well and notify your surgeon if you develop symptoms. If you test positive for Covid or have been in contact with anyone that has tested positive in the last 10 days please notify you surgeon.    [facility] - Preparing for Surgery Before surgery, you can play an important role.  Because skin is not sterile, your skin needs to be as  free of germs as possible.  You can reduce the number of germs on your skin by washing with CHG (chlorahexidine gluconate) soap before surgery.  CHG is an antiseptic cleaner which kills germs and bonds with the skin to continue killing germs even after washing. Please DO NOT use if you have an allergy to CHG or antibacterial soaps.  If your skin becomes reddened/irritated stop using the CHG and inform your nurse when you arrive at Short Stay. Do not shave (including legs and underarms) for at least 48 hours prior to the first CHG shower.  You may shave your face/neck. Please follow these instructions carefully:  1.  Shower with CHG Soap the night before surgery and the  morning of Surgery.  2.  If you choose to wash your hair, wash your hair first as usual with your  normal  shampoo.  3.  After you shampoo, rinse your hair and body thoroughly to remove the  shampoo.                           4.  Use CHG as you would any other liquid soap.  You can apply chg directly  to the skin and wash                       Gently with a scrungie or clean washcloth.  5.  Apply the CHG Soap to your body ONLY FROM THE NECK DOWN.   Do not use on face/ open                           Wound or open sores. Avoid contact with eyes, ears mouth and genitals (private parts).                       Wash face,  Genitals (private parts) with your normal soap.             6.  Wash thoroughly,  paying special attention to the area where your surgery  will be performed.  7.  Thoroughly rinse your body with warm water from the neck down.  8.  DO NOT shower/wash with your normal soap after using and rinsing off  the CHG Soap.                9.  Pat yourself dry with a clean towel.            10.  Wear clean pajamas.            11.  Place clean sheets on your bed the night of your first shower and do not  sleep with pets. Day of Surgery : Do not apply any lotions/deodorants the morning of surgery.  Please wear clean clothes to the  hospital/surgery center.  FAILURE TO FOLLOW THESE INSTRUCTIONS MAY RESULT IN THE CANCELLATION OF YOUR SURGERY PATIENT SIGNATURE_________________________________  NURSE SIGNATURE__________________________________  ________________________________________________________________________

## 2023-11-24 NOTE — Progress Notes (Signed)
 Anesthesia Review:  PCP: Cardiologist :  PPM/ ICD: Device Orders: Rep Notified:  Chest x-ray : EKG : Echo : Stress test: Cardiac Cath :   Activity level:  Sleep Study/ CPAP : Fasting Blood Sugar :      / Checks Blood Sugar -- times a day:     DM- type            Hgba1c-  Farxiga - hold for 72 hours prior- last dose on 11/26/23  Glipizie- none night before or am of surgery  Janumet  XR-   Blood Thinner/ Instructions Cherre Dose: ASA / Instructions/ Last Dose :    Latex Allergy

## 2023-11-25 ENCOUNTER — Other Ambulatory Visit: Payer: Self-pay

## 2023-11-25 ENCOUNTER — Encounter (HOSPITAL_COMMUNITY)
Admission: RE | Admit: 2023-11-25 | Discharge: 2023-11-25 | Disposition: A | Discharge status: Home / Self Care | Source: Ambulatory Visit | Attending: Urology | Admitting: Urology

## 2023-11-25 ENCOUNTER — Other Ambulatory Visit (HOSPITAL_COMMUNITY): Payer: Self-pay

## 2023-11-25 ENCOUNTER — Encounter (HOSPITAL_COMMUNITY): Payer: Self-pay

## 2023-11-25 VITALS — BP 131/87 | HR 76 | Temp 98.3°F | Resp 16 | Ht 63.0 in | Wt 173.0 lb

## 2023-11-25 DIAGNOSIS — Z01818 Encounter for other preprocedural examination: Secondary | ICD-10-CM | POA: Insufficient documentation

## 2023-11-25 HISTORY — DX: Pneumonia, unspecified organism: J18.9

## 2023-11-25 HISTORY — DX: Other complications of anesthesia, initial encounter: T88.59XA

## 2023-11-25 HISTORY — DX: Unspecified osteoarthritis, unspecified site: M19.90

## 2023-11-25 LAB — BASIC METABOLIC PANEL WITH GFR
Anion gap: 12 (ref 5–15)
BUN: 25 mg/dL — ABNORMAL HIGH (ref 6–20)
CO2: 23 mmol/L (ref 22–32)
Calcium: 9.6 mg/dL (ref 8.9–10.3)
Chloride: 104 mmol/L (ref 98–111)
Creatinine, Ser: 1.46 mg/dL — ABNORMAL HIGH (ref 0.44–1.00)
GFR, Estimated: 41 mL/min — ABNORMAL LOW (ref >60)
Glucose, Bld: 104 mg/dL — ABNORMAL HIGH (ref 70–99)
Potassium: 4.2 mmol/L (ref 3.5–5.1)
Sodium: 139 mmol/L (ref 135–145)

## 2023-11-25 LAB — GLUCOSE, CAPILLARY: Glucose-Capillary: 111 mg/dL — ABNORMAL HIGH (ref 70–99)

## 2023-11-25 LAB — CBC
HCT: 41.7 % (ref 36.0–46.0)
Hemoglobin: 12.9 g/dL (ref 12.0–15.0)
MCH: 29.7 pg (ref 26.0–34.0)
MCHC: 30.9 g/dL (ref 30.0–36.0)
MCV: 96.1 fL (ref 80.0–100.0)
Platelets: 323 K/uL (ref 150–400)
RBC: 4.34 MIL/uL (ref 3.87–5.11)
RDW: 14.4 % (ref 11.5–15.5)
WBC: 6.8 K/uL (ref 4.0–10.5)
nRBC: 0 % (ref 0.0–0.2)

## 2023-11-25 LAB — HEMOGLOBIN A1C
Hgb A1c MFr Bld: 8.7 % — ABNORMAL HIGH (ref 4.8–5.6)
Mean Plasma Glucose: 202.99 mg/dL

## 2023-11-29 NOTE — H&P (Signed)
 Office Visit Report     11/23/2023   --------------------------------------------------------------------------------   Tara House  MRN: [id number]  DOB: 04-21-1965, 58 year old Female  SSN:    PRIMARY CARE:  Caroleen Fort, MD  PRIMARY CARE FAX:  [phone]  REFERRING:  Bernardino Gasman, MD  PROVIDER:  Lonni Han, M.D.  TREATING:  Alan Clois [place], [place]  LOCATION:  [facility] [phone]     --------------------------------------------------------------------------------   CC/HPI: Pt presents today for pre-operative history and physical exam in anticipation of robotic assisted lap radical left nephrectomy by Dr. Han on 11/30/23. She is doing well and is without complaint.   She received clearance from her PCP. HgA1c in Aug 2025 was 10.9, however, this was after a Prednisone  dose pak for sciatica. PCP will recheck if not done by anesthesia in pre-op labs. Previous A1c was 6.4.   Pt denies F/C, HA, CP, SOB, N/V, diarrhea/constipation, back pain, flank pain, hematuria, and dysuria.     HX:    Left renal lesion   Tara House is a 58 year old female who was found to have a 4.5 cm complex cystic lesion involving the left kidney partially visualized on lumbar spine MRI in July during evaluation for low back pain with sciatica and further characterized via CT on 10/05/23, which confirms that the mass is solid and enhancing with features concerning for RCC.   -Anatomy: Large complex cystic lesion partially visualized on MRI. No apparent lesions involving the right kidney (partially visualized)  -Personal/family history of GU malignancies: denies  -Smoking history: non-smoker  -Prior abdominal surgeries: tubal ligation  -Renal function: Stage III CKD. Last serum creatinine was 1.56--Followed by Dr. Gasman with [facility]  -History of kidney stones: denies  -She denies left flank pain or prior episodes of gross hematuria.     ALLERGIES: Cashews Latex  - Skin Rash Simvastatin - leg cramps Trulicity - leg cramps     Notes: IV dye sensitivity felt weird.    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   Tobacco Use Assessment Completed: Used Tobacco in last 30 days? Does drink.  Does not use drugs. Drinks 2 caffeinated drinks per day. Has not had a blood transfusion. 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     Notes: ETOH once per month    REVIEW OF SYSTEMS:    GU Review Female:   Patient denies frequent urination, hard to postpone urination, burning /pain with urination, get up at night to urinate, leakage of urine, stream starts and stops, trouble starting your stream,  have to strain to urinate, and being pregnant.  Gastrointestinal (Upper):   Patient denies nausea, vomiting, and indigestion/ heartburn.  Gastrointestinal (Lower):   Patient denies diarrhea and constipation.  Constitutional:   Patient denies fever, night sweats, weight loss, and fatigue.  Skin:   Patient denies skin rash/  lesion and itching.  Eyes:   Patient denies blurred vision and double vision.  Ears/ Nose/ Throat:   Patient denies sore throat and sinus problems.  Hematologic/Lymphatic:   Patient denies swollen glands and easy bruising.  Cardiovascular:   Patient denies leg swelling and chest pains.  Respiratory:   Patient denies cough and shortness of breath.  Endocrine:   Patient denies excessive thirst.  Musculoskeletal:   Patient denies back pain and joint pain.  Neurological:   Patient denies headaches and dizziness.  Psychologic:   Patient denies depression and anxiety.   VITAL SIGNS:      11/23/2023 02:26 PM  Weight 163 lb / 73.94 kg  Height 63 in / 160.02 cm  BP 124/79 mmHg  Pulse 82 /min  Temperature 97.5 F / 36.3 C  BMI 28.9 kg/m   MULTI-SYSTEM PHYSICAL EXAMINATION:    Constitutional: Well-nourished. No physical deformities. Normally developed. Good grooming.  Neck: Neck symmetrical, not swollen. Normal tracheal position.  Respiratory: Normal breath sounds. No labored breathing, no use of accessory muscles.   Cardiovascular: Regular rate and rhythm. No murmur, no gallop.   Lymphatic: No enlargement of neck, axillae, groin.  Skin: No paleness, no jaundice, no cyanosis. No lesion, no ulcer, no rash.  Neurologic / Psychiatric: Oriented to time, oriented to place, oriented to person. No depression, no anxiety, no agitation.  Gastrointestinal: No mass, no tenderness, no rigidity, non obese abdomen.  Eyes: Normal conjunctivae. Normal eyelids.  Ears, Nose, Mouth, and Throat: Left ear no scars, no lesions, no masses. Right ear no scars, no lesions, no masses. Nose no scars, no lesions, no masses. Normal hearing. Normal lips.  Musculoskeletal: Normal gait and station of head and neck.     Complexity of Data:  Records Review:   Previous Patient Records  Urine Test Review:   Urinalysis   11/23/23  Urinalysis  Urine Appearance Cloudy   Urine Color Straw   Urine Glucose Neg mg/dL  Urine  Bilirubin Neg mg/dL  Urine Ketones Neg mg/dL  Urine Specific Gravity 1.020   Urine Blood Neg ery/uL  Urine pH 6.5   Urine Protein Neg mg/dL  Urine Urobilinogen 0.2 mg/dL  Urine Nitrites Neg   Urine Leukocyte Esterase 1+ leu/uL  Urine WBC/hpf 0 - 5/hpf   Urine RBC/hpf NS (Not Seen)   Urine Epithelial Cells 6 - 10/hpf   Urine Bacteria Many (>50/hpf)   Urine Mucous Not Present   Urine Yeast NS (Not Seen)   Urine Trichomonas Not Present   Urine Cystals NS (Not Seen)   Urine Casts NS (Not Seen)   Urine Sperm Not Present    PROCEDURES:          Urinalysis w/Scope - 81001 Dipstick Dipstick Cont'd Micro  Color: Straw Bilirubin: Neg mg/dL WBC/hpf: 0 - 5/hpf  Appearance: Cloudy Ketones: Neg mg/dL RBC/hpf: NS (Not Seen)  Specific Gravity: 1.020 Blood: Neg ery/uL Bacteria: Many (>50/hpf)  pH: 6.5 Protein: Neg mg/dL Cystals: NS (Not Seen)  Glucose: Neg mg/dL Urobilinogen: 0.2 mg/dL Casts: NS (Not Seen)    Nitrites: Neg Trichomonas: Not Present    Leukocyte Esterase: 1+ leu/uL Mucous: Not Present      Epithelial Cells: 6 -  10/hpf      Yeast: NS (Not Seen)      Sperm: Not Present    ASSESSMENT:      ICD-10 Details  1 GU:   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 Devere. Pt is scheduled to undergo left RAL radical nephrectomy on 11/30/23.    -I reviewed imaging results and films with the patient personally. We discussed that the mass in question has features concerning for malignancy. I explained the natural history of presumed renal cell carcinoma. I reviewed the AUA guidelines for evaluation and treatment of renal masses. The options of active surveillance, in situ tumor  ablation, partial and radical nephrectomy was discussed. The risks of robot-assisted laparoscopic LEFT radical nephrectomy were discussed in detail including but not limited to: negative pathology, open conversion, infection of the skin/abdominal cavity, VTE, MI/CVA, lymphatic leak, injury to adjacent solid/hollow viscus organs, bleeding requiring a blood transfusion, catastrophic bleeding, hernia formation and other imponderables. The patient voices understanding and wishes to proceed.   Urine for culture.

## 2023-11-30 ENCOUNTER — Ambulatory Visit (HOSPITAL_BASED_OUTPATIENT_CLINIC_OR_DEPARTMENT_OTHER): Payer: Self-pay | Admitting: Anesthesiology

## 2023-11-30 ENCOUNTER — Encounter (HOSPITAL_COMMUNITY)
Admission: RE | Disposition: A | Discharge status: Home / Self Care | Payer: Self-pay | Source: Home / Self Care | Attending: Urology

## 2023-11-30 ENCOUNTER — Other Ambulatory Visit: Payer: Self-pay

## 2023-11-30 ENCOUNTER — Observation Stay (HOSPITAL_COMMUNITY)
Admission: RE | Admit: 2023-11-30 | Discharge: 2023-12-01 | Disposition: A | Discharge status: Home / Self Care | Attending: Urology | Admitting: Urology

## 2023-11-30 ENCOUNTER — Other Ambulatory Visit (HOSPITAL_COMMUNITY): Payer: Self-pay

## 2023-11-30 ENCOUNTER — Ambulatory Visit (HOSPITAL_COMMUNITY): Payer: Self-pay | Admitting: Physician Assistant

## 2023-11-30 ENCOUNTER — Encounter (HOSPITAL_COMMUNITY): Payer: Self-pay | Admitting: Urology

## 2023-11-30 DIAGNOSIS — Z79899 Other long term (current) drug therapy: Secondary | ICD-10-CM | POA: Insufficient documentation

## 2023-11-30 DIAGNOSIS — N2889 Other specified disorders of kidney and ureter: Secondary | ICD-10-CM | POA: Diagnosis not present

## 2023-11-30 DIAGNOSIS — N183 Chronic kidney disease, stage 3 unspecified: Secondary | ICD-10-CM | POA: Insufficient documentation

## 2023-11-30 DIAGNOSIS — E1122 Type 2 diabetes mellitus with diabetic chronic kidney disease: Secondary | ICD-10-CM | POA: Diagnosis not present

## 2023-11-30 DIAGNOSIS — I129 Hypertensive chronic kidney disease with stage 1 through stage 4 chronic kidney disease, or unspecified chronic kidney disease: Secondary | ICD-10-CM | POA: Diagnosis not present

## 2023-11-30 DIAGNOSIS — Z91040 Latex allergy status: Secondary | ICD-10-CM | POA: Insufficient documentation

## 2023-11-30 DIAGNOSIS — D49512 Neoplasm of unspecified behavior of left kidney: Secondary | ICD-10-CM | POA: Diagnosis not present

## 2023-11-30 DIAGNOSIS — Z01818 Encounter for other preprocedural examination: Secondary | ICD-10-CM

## 2023-11-30 DIAGNOSIS — C642 Malignant neoplasm of left kidney, except renal pelvis: Principal | ICD-10-CM | POA: Insufficient documentation

## 2023-11-30 DIAGNOSIS — N280 Ischemia and infarction of kidney: Secondary | ICD-10-CM | POA: Diagnosis not present

## 2023-11-30 DIAGNOSIS — E871 Hypo-osmolality and hyponatremia: Secondary | ICD-10-CM | POA: Insufficient documentation

## 2023-11-30 HISTORY — PX: ROBOT ASSISTED LAPAROSCOPIC NEPHRECTOMY: SHX5140

## 2023-11-30 LAB — GLUCOSE, CAPILLARY
Glucose-Capillary: 112 mg/dL — ABNORMAL HIGH (ref 70–99)
Glucose-Capillary: 200 mg/dL — ABNORMAL HIGH (ref 70–99)
Glucose-Capillary: 172 mg/dL — ABNORMAL HIGH (ref 70–99)
Glucose-Capillary: 148 mg/dL — ABNORMAL HIGH (ref 70–99)

## 2023-11-30 LAB — TYPE AND SCREEN
ABO/RH(D): O POS
Antibody Screen: NEGATIVE

## 2023-11-30 LAB — ABO/RH: ABO/RH(D): O POS

## 2023-11-30 SURGERY — NEPHRECTOMY, RADICAL, ROBOT-ASSISTED, LAPAROSCOPIC, ADULT
Anesthesia: General | Site: Abdomen | Laterality: Left

## 2023-11-30 MED ORDER — CEFAZOLIN SODIUM-DEXTROSE 2-4 GM/100ML-% IV SOLN
2.0000 g | INTRAVENOUS | Status: AC
  Administered 2023-11-30: 2 g via INTRAVENOUS
  Filled 2023-11-30: qty 100

## 2023-11-30 MED ORDER — BUPIVACAINE LIPOSOME 1.3 % IJ SUSP
INTRAMUSCULAR | Status: AC
  Filled 2023-11-30: qty 20

## 2023-11-30 MED ORDER — FENTANYL CITRATE (PF) 250 MCG/5ML IJ SOLN
INTRAMUSCULAR | Status: DC | PRN
  Administered 2023-11-30: 100 ug via INTRAVENOUS
  Administered 2023-11-30: 50 ug via INTRAVENOUS
  Administered 2023-11-30 (×2): 25 ug via INTRAVENOUS
  Administered 2023-11-30: 50 ug via INTRAVENOUS

## 2023-11-30 MED ORDER — SODIUM CHLORIDE (PF) 0.9 % IJ SOLN
INTRAMUSCULAR | Status: AC
  Filled 2023-11-30: qty 20

## 2023-11-30 MED ORDER — HYDROCODONE-ACETAMINOPHEN 5-325 MG PO TABS
1.0000 | ORAL_TABLET | Freq: Four times a day (QID) | ORAL | 0 refills | Status: AC | PRN
  Filled 2023-11-30: qty 20, 3d supply, fill #0

## 2023-11-30 MED ORDER — FENTANYL CITRATE (PF) 250 MCG/5ML IJ SOLN
INTRAMUSCULAR | Status: AC
  Filled 2023-11-30: qty 5

## 2023-11-30 MED ORDER — INSULIN ASPART 100 UNIT/ML IJ SOLN
0.0000 [IU] | Freq: Three times a day (TID) | INTRAMUSCULAR | Status: DC
  Administered 2023-11-30: 3 [IU] via SUBCUTANEOUS
  Administered 2023-12-01: 2 [IU] via SUBCUTANEOUS

## 2023-11-30 MED ORDER — GABAPENTIN 300 MG PO CAPS
300.0000 mg | ORAL_CAPSULE | Freq: Every evening | ORAL | Status: DC | PRN
  Filled 2023-11-30: qty 1

## 2023-11-30 MED ORDER — PROPOFOL 10 MG/ML IV BOLUS
INTRAVENOUS | Status: AC
  Filled 2023-11-30: qty 20

## 2023-11-30 MED ORDER — LIDOCAINE HCL (PF) 2 % IJ SOLN
INTRAMUSCULAR | Status: DC | PRN
  Administered 2023-11-30: 60 mg via INTRADERMAL

## 2023-11-30 MED ORDER — HYDRALAZINE HCL 20 MG/ML IJ SOLN
2.0000 mg | INTRAMUSCULAR | Status: DC | PRN
  Administered 2023-11-30: 2 mg via INTRAVENOUS
  Filled 2023-11-30: qty 1

## 2023-11-30 MED ORDER — ROCURONIUM BROMIDE 10 MG/ML (PF) SYRINGE
PREFILLED_SYRINGE | INTRAVENOUS | Status: AC
  Filled 2023-11-30: qty 10

## 2023-11-30 MED ORDER — ONDANSETRON HCL 4 MG/2ML IJ SOLN
4.0000 mg | INTRAMUSCULAR | Status: DC | PRN
  Administered 2023-11-30 (×2): 4 mg via INTRAVENOUS
  Filled 2023-11-30 (×2): qty 2

## 2023-11-30 MED ORDER — MIDAZOLAM HCL 2 MG/2ML IJ SOLN
INTRAMUSCULAR | Status: AC
  Filled 2023-11-30: qty 2

## 2023-11-30 MED ORDER — ORAL CARE MOUTH RINSE: 15.0000 mL | Freq: Once | OROMUCOSAL | Status: AC

## 2023-11-30 MED ORDER — OXYCODONE HCL 5 MG/5ML PO SOLN: 5.0000 mg | Freq: Once | ORAL | Status: DC | PRN

## 2023-11-30 MED ORDER — DEXAMETHASONE SODIUM PHOSPHATE 10 MG/ML IJ SOLN
INTRAMUSCULAR | Status: DC | PRN
  Administered 2023-11-30: 4 mg via INTRAVENOUS

## 2023-11-30 MED ORDER — LIDOCAINE HCL (PF) 2 % IJ SOLN
INTRAMUSCULAR | Status: AC
  Filled 2023-11-30: qty 5

## 2023-11-30 MED ORDER — DOCUSATE SODIUM 100 MG PO CAPS: 100.0000 mg | ORAL_CAPSULE | Freq: Two times a day (BID) | ORAL | Status: AC

## 2023-11-30 MED ORDER — STERILE WATER FOR IRRIGATION IR SOLN
Status: DC | PRN
  Administered 2023-11-30: 1000 mL

## 2023-11-30 MED ORDER — ONDANSETRON HCL 4 MG/2ML IJ SOLN
INTRAMUSCULAR | Status: DC | PRN
  Administered 2023-11-30: 4 mg via INTRAVENOUS

## 2023-11-30 MED ORDER — SUGAMMADEX SODIUM 200 MG/2ML IV SOLN
INTRAVENOUS | Status: DC | PRN
  Administered 2023-11-30: 200 mg via INTRAVENOUS

## 2023-11-30 MED ORDER — HYDROMORPHONE HCL 1 MG/ML IJ SOLN
INTRAMUSCULAR | Status: AC
  Filled 2023-11-30: qty 2

## 2023-11-30 MED ORDER — SODIUM CHLORIDE (PF) 0.9 % IJ SOLN
INTRAMUSCULAR | Status: DC | PRN
  Administered 2023-11-30: 40 mL

## 2023-11-30 MED ORDER — SODIUM CHLORIDE 0.45 % IV SOLN
INTRAVENOUS | Status: DC
Start: 2023-11-30 — End: 2023-12-01

## 2023-11-30 MED ORDER — MECLIZINE HCL 25 MG PO TABS: 25.0000 mg | ORAL_TABLET | Freq: Three times a day (TID) | ORAL | Status: DC | PRN

## 2023-11-30 MED ORDER — ONDANSETRON HCL 4 MG/2ML IJ SOLN
INTRAMUSCULAR | Status: AC
  Filled 2023-11-30: qty 2

## 2023-11-30 MED ORDER — LACTATED RINGERS IV SOLN: INTRAVENOUS | Status: DC

## 2023-11-30 MED ORDER — HYDROMORPHONE HCL 1 MG/ML IJ SOLN
0.5000 mg | INTRAMUSCULAR | Status: DC | PRN
  Administered 2023-11-30 – 2023-12-01 (×3): 1 mg via INTRAVENOUS
  Filled 2023-11-30 (×3): qty 1

## 2023-11-30 MED ORDER — ACETAMINOPHEN 10 MG/ML IV SOLN
1000.0000 mg | Freq: Four times a day (QID) | INTRAVENOUS | Status: AC
  Administered 2023-11-30 – 2023-12-01 (×4): 1000 mg via INTRAVENOUS
  Filled 2023-11-30 (×4): qty 100

## 2023-11-30 MED ORDER — DIPHENHYDRAMINE HCL 12.5 MG/5ML PO ELIX: 12.5000 mg | ORAL_SOLUTION | Freq: Four times a day (QID) | ORAL | Status: DC | PRN

## 2023-11-30 MED ORDER — MIDAZOLAM HCL 2 MG/2ML IJ SOLN
INTRAMUSCULAR | Status: DC | PRN
  Administered 2023-11-30: 2 mg via INTRAVENOUS

## 2023-11-30 MED ORDER — ROCURONIUM BROMIDE 10 MG/ML (PF) SYRINGE
PREFILLED_SYRINGE | INTRAVENOUS | Status: DC | PRN
  Administered 2023-11-30: 20 mg via INTRAVENOUS
  Administered 2023-11-30: 10 mg via INTRAVENOUS
  Administered 2023-11-30: 50 mg via INTRAVENOUS

## 2023-11-30 MED ORDER — OXIDIZED CELLULOSE EX PADS
MEDICATED_PAD | CUTANEOUS | Status: DC | PRN
  Administered 2023-11-30: 1 via TOPICAL

## 2023-11-30 MED ORDER — DIPHENHYDRAMINE HCL 50 MG/ML IJ SOLN: 12.5000 mg | Freq: Four times a day (QID) | INTRAMUSCULAR | Status: DC | PRN

## 2023-11-30 MED ORDER — DEXMEDETOMIDINE HCL IN NACL 80 MCG/20ML IV SOLN
INTRAVENOUS | Status: DC | PRN
  Administered 2023-11-30: 10 ug via INTRAVENOUS

## 2023-11-30 MED ORDER — ARTIFICIAL TEARS OPHTHALMIC OINT
TOPICAL_OINTMENT | OPHTHALMIC | Status: AC
  Filled 2023-11-30: qty 3.5

## 2023-11-30 MED ORDER — OXYCODONE HCL 5 MG PO TABS
5.0000 mg | ORAL_TABLET | ORAL | Status: DC | PRN
  Administered 2023-11-30 – 2023-12-01 (×4): 5 mg via ORAL
  Filled 2023-11-30 (×4): qty 1

## 2023-11-30 MED ORDER — ROSUVASTATIN CALCIUM 10 MG PO TABS: 10.0000 mg | ORAL_TABLET | ORAL | Status: DC

## 2023-11-30 MED ORDER — LISDEXAMFETAMINE DIMESYLATE 30 MG PO CAPS: 60.0000 mg | ORAL_CAPSULE | Freq: Every morning | ORAL | Status: DC

## 2023-11-30 MED ORDER — INSULIN ASPART 100 UNIT/ML IJ SOLN
INTRAMUSCULAR | Status: AC
  Filled 2023-11-30: qty 1

## 2023-11-30 MED ORDER — DOCUSATE SODIUM 100 MG PO CAPS
100.0000 mg | ORAL_CAPSULE | Freq: Two times a day (BID) | ORAL | Status: DC
  Administered 2023-11-30 – 2023-12-01 (×3): 100 mg via ORAL
  Filled 2023-11-30 (×4): qty 1

## 2023-11-30 MED ORDER — OXYCODONE HCL 5 MG PO TABS: 5.0000 mg | ORAL_TABLET | Freq: Once | ORAL | Status: DC | PRN

## 2023-11-30 MED ORDER — HYOSCYAMINE SULFATE 0.125 MG SL SUBL: 0.1250 mg | SUBLINGUAL_TABLET | SUBLINGUAL | Status: DC | PRN

## 2023-11-30 MED ORDER — ONDANSETRON HCL 4 MG/2ML IJ SOLN: 4.0000 mg | Freq: Four times a day (QID) | INTRAMUSCULAR | Status: DC | PRN

## 2023-11-30 MED ORDER — HYDROMORPHONE HCL 1 MG/ML IJ SOLN
0.2500 mg | INTRAMUSCULAR | Status: DC | PRN
  Administered 2023-11-30 (×3): 0.5 mg via INTRAVENOUS

## 2023-11-30 MED ORDER — CHLORHEXIDINE GLUCONATE 0.12 % MT SOLN
15.0000 mL | Freq: Once | OROMUCOSAL | Status: AC
  Administered 2023-11-30: 15 mL via OROMUCOSAL

## 2023-11-30 MED ORDER — INSULIN ASPART 100 UNIT/ML IJ SOLN
4.0000 [IU] | Freq: Once | INTRAMUSCULAR | Status: AC
  Administered 2023-11-30: 4 [IU] via SUBCUTANEOUS

## 2023-11-30 MED ORDER — DEXAMETHASONE SODIUM PHOSPHATE 10 MG/ML IJ SOLN
INTRAMUSCULAR | Status: AC
  Filled 2023-11-30: qty 1

## 2023-11-30 MED ORDER — PROPOFOL 10 MG/ML IV BOLUS
INTRAVENOUS | Status: DC | PRN
  Administered 2023-11-30: 150 mg via INTRAVENOUS

## 2023-11-30 MED ORDER — DEXMEDETOMIDINE HCL IN NACL 80 MCG/20ML IV SOLN
INTRAVENOUS | Status: AC
  Filled 2023-11-30: qty 20

## 2023-11-30 MED ORDER — SODIUM CHLORIDE 0.9 % IV SOLN: INTRAVENOUS | Status: DC | PRN

## 2023-11-30 SURGICAL SUPPLY — 53 items
BAG COUNTER SPONGE SURGICOUNT (BAG) IMPLANT
BAG LAPAROSCOPIC 12 15 PORT 16 (BASKET) ×1 IMPLANT
CAUTERY HOOK MNPLR 1.6 DVNC XI (INSTRUMENTS) ×1 IMPLANT
CHLORAPREP W/TINT 26 (MISCELLANEOUS) ×1 IMPLANT
CLIP LIGATING HEM O LOK PURPLE (MISCELLANEOUS) ×1 IMPLANT
CLIP LIGATING HEMO LOK XL GOLD (MISCELLANEOUS) ×1 IMPLANT
CLIP LIGATING HEMO O LOK GREEN (MISCELLANEOUS) IMPLANT
COVER SURGICAL LIGHT HANDLE (MISCELLANEOUS) ×1 IMPLANT
COVER TIP SHEARS 8 DVNC (MISCELLANEOUS) ×1 IMPLANT
DERMABOND ADVANCED .7 DNX12 (GAUZE/BANDAGES/DRESSINGS) ×1 IMPLANT
DRAPE ARM DVNC X/XI (DISPOSABLE) ×4 IMPLANT
DRAPE COLUMN DVNC XI (DISPOSABLE) ×1 IMPLANT
DRAPE INCISE IOBAN 66X45 STRL (DRAPES) ×1 IMPLANT
DRAPE SHEET LG 3/4 BI-LAMINATE (DRAPES) ×1 IMPLANT
DRIVER NDL LRG 8 DVNC XI (INSTRUMENTS) ×2 IMPLANT
DRIVER NDLE LRG 8 DVNC XI (INSTRUMENTS) ×2 IMPLANT
ELECT PENCIL ROCKER SW 15FT (MISCELLANEOUS) ×1 IMPLANT
ELECT REM PT RETURN 15FT ADLT (MISCELLANEOUS) ×1 IMPLANT
FORCEPS PROGRASP DVNC XI (FORCEP) ×2 IMPLANT
GAUZE 4X4 16PLY ~~LOC~~+RFID DBL (SPONGE) IMPLANT
GLOVE BIO SURGEON STRL SZ 6.5 (GLOVE) ×1 IMPLANT
GLOVE BIOGEL PI IND STRL 8 (GLOVE) ×1 IMPLANT
GLOVE SURG LX STRL 8.0 MICRO (GLOVE) ×2 IMPLANT
GOWN STRL REUS W/ TWL XL LVL3 (GOWN DISPOSABLE) ×2 IMPLANT
GOWN STRL SURGICAL XL XLNG (GOWN DISPOSABLE) ×1 IMPLANT
HEMOSTAT SURGICEL 4X8 (HEMOSTASIS) IMPLANT
HOLDER FOLEY CATH W/STRAP (MISCELLANEOUS) ×1 IMPLANT
IRRIGATION SUCT STRKRFLW 2 WTP (MISCELLANEOUS) ×1 IMPLANT
KIT BASIN OR (CUSTOM PROCEDURE TRAY) ×1 IMPLANT
KIT TURNOVER KIT A (KITS) ×1 IMPLANT
MARKER SKIN DUAL TIP RULER LAB (MISCELLANEOUS) ×1 IMPLANT
NDL INSUFFLATION 14GA 120MM (NEEDLE) ×1 IMPLANT
NEEDLE INSUFFLATION 14GA 120MM (NEEDLE) ×1 IMPLANT
PAD POSITIONING PINK XL (MISCELLANEOUS) ×1 IMPLANT
PROTECTOR NERVE ULNAR (MISCELLANEOUS) ×2 IMPLANT
RELOAD STAPLE 45 2.6 WHT THIN (STAPLE) IMPLANT
SCISSORS LAP 5X45 EPIX DISP (ENDOMECHANICALS) ×1 IMPLANT
SCISSORS MNPLR CVD DVNC XI (INSTRUMENTS) ×1 IMPLANT
SEAL UNIV 5-12 XI (MISCELLANEOUS) ×4 IMPLANT
SET TUBE SMOKE EVAC HIGH FLOW (TUBING) ×1 IMPLANT
SOLUTION ELECTROSURG ANTI STCK (MISCELLANEOUS) ×1 IMPLANT
SPIKE FLUID TRANSFER (MISCELLANEOUS) ×1 IMPLANT
STAPLER POWER ECHELON 45 WIDE (STAPLE) IMPLANT
SUT MNCRL AB 4-0 PS2 18 (SUTURE) ×2 IMPLANT
SUT PDS AB 0 CT1 36 (SUTURE) ×2 IMPLANT
SUT VIC AB 0 CT1 27XBRD ANTBC (SUTURE) ×1 IMPLANT
SUT VIC AB 2-0 SH 27X BRD (SUTURE) ×1 IMPLANT
TOWEL OR 17X26 10 PK STRL BLUE (TOWEL DISPOSABLE) ×1 IMPLANT
TRAY FOLEY MTR SLVR 16FR STAT (SET/KITS/TRAYS/PACK) ×1 IMPLANT
TRAY LAPAROSCOPIC (CUSTOM PROCEDURE TRAY) ×1 IMPLANT
TROCAR Z THREAD OPTICAL 12X100 (TROCAR) ×1 IMPLANT
TROCAR Z-THREAD OPTICAL 5X100M (TROCAR) IMPLANT
WATER STERILE IRR 1000ML POUR (IV SOLUTION) ×1 IMPLANT

## 2023-11-30 NOTE — Transfer of Care (Signed)
 Immediate Anesthesia Transfer of Care Note  Patient: Tara House  Procedure(s) Performed: NEPHRECTOMY, RADICAL, ROBOT-ASSISTED, LAPAROSCOPIC, ADULT (Left: Abdomen)  Patient Location: PACU  Anesthesia Type:General  Level of Consciousness: drowsy  Airway & Oxygen  Therapy: Patient Spontanous Breathing and Patient connected to face mask oxygen   Post-op Assessment: Report given to RN and Post -op Vital signs reviewed and stable  Post vital signs: Reviewed and stable  Last Vitals:  Vitals Value Taken Time  BP 132/72 11/30/23 10:24  Temp    Pulse 74 11/30/23 10:24  Resp 14 11/30/23 10:24  SpO2 99% 11/30/23 10:24    Last Pain:  Vitals:   11/30/23 0556  TempSrc: Oral         Complications: No notable events documented.

## 2023-11-30 NOTE — Anesthesia Preprocedure Evaluation (Signed)
 Anesthesia Evaluation  Patient identified by MRN, date of birth, ID band Patient awake    Reviewed: Allergy & Precautions, H&P , NPO status , Patient's Chart, lab work & pertinent test results  Airway Mallampati: II   Neck ROM: full    Dental   Pulmonary neg pulmonary ROS   breath sounds clear to auscultation       Cardiovascular hypertension,  Rhythm:regular Rate:Normal     Neuro/Psych  Headaches    GI/Hepatic   Endo/Other  diabetes, Type 2    Renal/GU Renal mass     Musculoskeletal  (+) Arthritis ,    Abdominal   Peds  Hematology   Anesthesia Other Findings   Reproductive/Obstetrics                              Anesthesia Physical Anesthesia Plan  ASA: 2  Anesthesia Plan: General   Post-op Pain Management:    Induction: Intravenous  PONV Risk Score and Plan: 3 and Ondansetron , Dexamethasone, Midazolam and Treatment may vary due to age or medical condition  Airway Management Planned: Oral ETT  Additional Equipment:   Intra-op Plan:   Post-operative Plan: Extubation in OR  Informed Consent: I have reviewed the patients History and Physical, chart, labs and discussed the procedure including the risks, benefits and alternatives for the proposed anesthesia with the patient or authorized representative who has indicated his/her understanding and acceptance.     Dental advisory given  Plan Discussed with: CRNA, Anesthesiologist and Surgeon  Anesthesia Plan Comments:         Anesthesia Quick Evaluation

## 2023-11-30 NOTE — Anesthesia Postprocedure Evaluation (Signed)
 Anesthesia Post Note  Patient: Tara House [facility]  Procedure(s) Performed: NEPHRECTOMY, RADICAL, ROBOT-ASSISTED, LAPAROSCOPIC, ADULT (Left: Abdomen)     Patient location during evaluation: PACU Anesthesia Type: General Level of consciousness: awake and alert Pain management: pain level controlled Vital Signs Assessment: post-procedure vital signs reviewed and stable Respiratory status: spontaneous breathing, nonlabored ventilation, respiratory function stable and patient connected to nasal cannula oxygen  Cardiovascular status: blood pressure returned to baseline and stable Postop Assessment: no apparent nausea or vomiting Anesthetic complications: no   No notable events documented.  Last Vitals:  Vitals:   11/30/23 1300 11/30/23 1347  BP: (!) 178/85 (!) 173/89  Pulse: 81 76  Resp: 14 17  Temp:    SpO2: 100% 95%    Last Pain:  Vitals:   11/30/23 1300  TempSrc:   PainSc: Asleep                 Linsy Ehresman S

## 2023-11-30 NOTE — Progress Notes (Addendum)
 Pt.  Refused to ambulate at this time as she is very nauseated & has had ongoing pain after surgery. Explained importance of ambulation to patient & she agreed to try later when this eases up.

## 2023-11-30 NOTE — Anesthesia Procedure Notes (Signed)
 Procedure Name: Intubation Date/Time: 11/30/2023 7:40 AM  Performed by: Augusta Daved SAILOR, CRNAPre-anesthesia Checklist: Patient identified, Emergency Drugs available, Suction available and Patient being monitored Patient Re-evaluated:Patient Re-evaluated prior to induction Oxygen  Delivery Method: Circle System Utilized Preoxygenation: Pre-oxygenation with 100% oxygen  Induction Type: IV induction Ventilation: Mask ventilation without difficulty Laryngoscope Size: Glidescope and 3 Grade View: Grade I Tube type: Oral Tube size: 7.0 mm Number of attempts: 1 Airway Equipment and Method: Stylet and Oral airway Placement Confirmation: ETT inserted through vocal cords under direct vision, positive ETCO2 and breath sounds checked- equal and bilateral Secured at: 22 (at the lips) cm Tube secured with: Tape Dental Injury: Teeth and Oropharynx as per pre-operative assessment

## 2023-11-30 NOTE — Discharge Instructions (Signed)

## 2023-11-30 NOTE — Op Note (Signed)
 Operative Note  Preoperative diagnosis:  1.  4.5 cm left renal mass with features concerning for renal cell carcinoma  Postoperative diagnosis: 1.  4.5 cm left renal mass with effusion concerning for renal cell carcinoma  Procedure(s): 1.  Robot-assisted laparoscopic left radical nephrectomy (not adrenal sparing)  Surgeon: Lonni Han, MD  Assistants:   Alan Hammonds, PA-C An assistant was required for this surgical procedure.  The duties of the assistant included but were not limited to suctioning, passing suture, camera manipulation, retraction.  This procedure would not be able to be performed without an Geophysicist/field seismologist.   Anesthesia:  General  Complications:  None  EBL: 50 mL  Specimens: 1.  Left kidney and adrenal gland  Drains/Catheters: 1.  Foley catheter  Intraoperative findings:   Prominent left renal perihilar lymphatic tissue Densely adherent left adrenal gland Left renal hilum was hemostatic following staple ligation of the artery and vein  Indication:  Tara House is a 58 y.o. female with a solid and enhancing 4.5 cm left renal mass with features concerning for renal cell carcinoma.  The patient has been consented for the above procedures, voices understanding and wishes to proceed.  Description of procedure:  After informed consent was obtained, the patient was brought to the operating room and general endotracheal anesthesia was administered.  The patient was then placed in the right lateral decubitus position and prepped and draped in usual sterile fashion.  A timeout was performed.  An 8 mm incision was then made lateral to the left rectus muscle at the level of the left 12th rib.  Abdominal access was obtained via a Veress needle.  The abdominal cavity was then insufflated up to 15 mmHg.  An 8 mm port was then introduced into the abdominal cavity.  Inspection of the port entry site by the robotic camera revealed no adjacent organ injury.  We then  placed 3 additional 8 mm robotic ports to triangulate the left renal hilum.  A 12 mm assistant port was then placed between the carmera port and 3rd robotic arm.  The white line of Toldt along the descending colon was incised sharply and the colon, along with its mesocolonic fat, was reflected medially until the aorta was identified.  We then made a small window adjacent to the lower pole of the left kidney, identifying the left psoas muscle, left ureter and left gonadal vein.  The left ureter and gonadal vein were then reflected anteriorly allowing us  to then incised the perihilar attachments using electrocautery.  We encountered a small lumbar vein adjacent to the insertion of the left gonadal vein into the left renal vein.  This lumbar vein was ligated with hemo-lock clips in 2 places and incised sharply.  This provided us  excellent exposure to the left renal hilum.    A 45 mm endovascular stapler was then used to ligate the left renal artery and then the left renal vein, achieving excellent hemostasis.  The remaining peri-renal and peri-hilar lymphatic attachments were then excised using a combination of hemologic clips and electrocautery.  The left adrenal gland was densely adherent to the medial aspects of the left kidney and could not be spared out of concern for compromised blood supply.  The endovascular stapler was then used to ligate the left gonadal vein and left ureter.  Once the kidney was freely mobile, it was placed in Endo Catch bag to be be retrieved at the conclusion of the case.  The robot was then de-docked.  A left  lower quadrant Gibson incision was then made and the mass was removed within the Endo Catch bag.  The fascia within the midline assistant port was then closed using an interrupted 0 Vicryl suture.  The fascia of the internal and external oblique was then closed using a 0 PDS suture in a running fashion.  The subcutaneous tissue within the [facility] incision was then closed using a  running 0 Vicryl suture.  All skin incisions were then closed using 4-0 Monocryl and then dressed with Dermabond.  The patient tolerated the procedure well and was transferred to the postanesthesia in stable condition.    Plan: Monitor on the floor overnight

## 2023-12-01 ENCOUNTER — Other Ambulatory Visit (HOSPITAL_COMMUNITY): Payer: Self-pay

## 2023-12-01 ENCOUNTER — Other Ambulatory Visit (HOSPITAL_BASED_OUTPATIENT_CLINIC_OR_DEPARTMENT_OTHER): Payer: Self-pay

## 2023-12-01 ENCOUNTER — Encounter (HOSPITAL_COMMUNITY): Payer: Self-pay | Admitting: Urology

## 2023-12-01 DIAGNOSIS — C642 Malignant neoplasm of left kidney, except renal pelvis: Secondary | ICD-10-CM | POA: Diagnosis not present

## 2023-12-01 LAB — BASIC METABOLIC PANEL WITH GFR
Anion gap: 15 (ref 5–15)
BUN: 26 mg/dL — ABNORMAL HIGH (ref 6–20)
CO2: 21 mmol/L — ABNORMAL LOW (ref 22–32)
Calcium: 8.9 mg/dL (ref 8.9–10.3)
Chloride: 100 mmol/L (ref 98–111)
Creatinine, Ser: 1.91 mg/dL — ABNORMAL HIGH (ref 0.44–1.00)
GFR, Estimated: 30 mL/min — ABNORMAL LOW (ref >60)
Glucose, Bld: 79 mg/dL (ref 70–99)
Potassium: 5 mmol/L (ref 3.5–5.1)
Sodium: 135 mmol/L (ref 135–145)

## 2023-12-01 LAB — HEMOGLOBIN AND HEMATOCRIT, BLOOD
HCT: 38.2 % (ref 36.0–46.0)
Hemoglobin: 11.6 g/dL — ABNORMAL LOW (ref 12.0–15.0)

## 2023-12-01 LAB — GLUCOSE, CAPILLARY
Glucose-Capillary: 93 mg/dL (ref 70–99)
Glucose-Capillary: 135 mg/dL — ABNORMAL HIGH (ref 70–99)
Glucose-Capillary: 77 mg/dL (ref 70–99)

## 2023-12-01 LAB — SURGICAL PATHOLOGY

## 2023-12-01 MED ORDER — BISACODYL 10 MG RE SUPP
10.0000 mg | Freq: Once | RECTAL | Status: AC
  Administered 2023-12-01: 10 mg via RECTAL
  Filled 2023-12-01: qty 1

## 2023-12-01 NOTE — Progress Notes (Signed)
 1 Day Post-Op Subjective: Patient reports soreness. Some nausea last night that has resolved.  No flatus.  Ambulated this morning x 1.   Objective: Vital signs in last 24 hours: Temp:  [97.4 F (36.3 C)-98.6 F (37 C)] 97.4 F (36.3 C) (10/01 0758) Pulse Rate:  [70-84] 77 (10/01 0758) Resp:  [12-18] 16 (10/01 0758) BP: (111-178)/(58-89) 111/58 (10/01 0758) SpO2:  [95 %-100 %] 97 % (10/01 0758)  Intake/Output from previous day: 09/30 0701 - 10/01 0700 In: 4046.1 [P.O.:480; I.V.:3266; IV Piggyback:300.1] Out: 1025 [Urine:975; Blood:50] Intake/Output this shift: No intake/output data recorded.  Physical Exam:  General:alert, cooperative, and no distress Cardiovascular: RRR Lungs: CTA GI: soft, approp tender, ND, faint bowel sounds Incisions: C/D/I Urine: clear  Extremities:SCDs in place  Lab Results: Recent Labs    12/01/23 0419  HGB 11.6*  HCT 38.2   BMET No results for input(s): NA, K, CL, CO2, GLUCOSE, BUN, CREATININE, CALCIUM  in the last 72 hours. No results for input(s): LABPT, INR in the last 72 hours. No results for input(s): LABURIN in the last 72 hours. Results for orders placed or performed in visit on 08/21/20  Novel Coronavirus, NAA ([facility])     Status: None   Collection Time: 08/21/20  3:22 PM   Specimen: Nasopharyngeal(NP) swabs in vial transport medium   Nasopharynge  Testing  Result Value Ref Range Status   SARS-CoV-2, NAA Not Detected Not Detected Final    Comment: This nucleic acid amplification test was developed and its performance characteristics determined by [employer]. Nucleic acid amplification tests include RT-PCR and TMA. This test has not been FDA cleared or approved. This test has been authorized by FDA under an Emergency Use Authorization (EUA). This test is only authorized for the duration of time the declaration that circumstances exist justifying the authorization of the emergency use of in  vitro diagnostic tests for detection of SARS-CoV-2 virus and/or diagnosis of COVID-19 infection under section 564(b)(1) of the Act, 21 U.S.C. 639aaa-6(a) (1), unless the authorization is terminated or revoked sooner. When diagnostic testing is negative, the possibility of a false negative result should be considered in the context of a patient's recent exposures and the presence of clinical signs and symptoms consistent with COVID-19. An individual without symptoms of COVID-19 and who is not shedding SARS-CoV-2 virus wo uld expect to have a negative (not detected) result in this assay.   SARS-COV-2, NAA 2 DAY TAT     Status: None   Collection Time: 08/21/20  3:22 PM   Nasopharynge  Testing  Result Value Ref Range Status   SARS-CoV-2, NAA 2 DAY TAT Performed  Final    Studies/Results: No results found.  Assessment/Plan: 1 Day Post-Op, Procedure(s) (LRB): NEPHRECTOMY, RADICAL, ROBOT-ASSISTED, LAPAROSCOPIC, ADULT (Left)  Ambulate, Incentive spirometry DVT prophylaxis Transition to PO pain medications SL IVF Advance diet D/c foley Dulcolax supp Poss d/c later today   LOS: 0 days   Tara House 12/01/2023, 8:33 AM

## 2023-12-01 NOTE — Discharge Summary (Signed)
 Date of admission: 11/30/2023  Date of discharge: 12/01/2023  Admission diagnosis: left renal mass  Discharge diagnosis: same  Secondary diagnoses: DM, HLD, HTN  History and Physical: For full details, please see admission history and physical. Briefly, Tara House is a 58 y.o. year old patient with a 4.5 cm complex cystic lesion involving the left kidney partially visualized on lumbar spine MRI in July during evaluation for low back pain with sciatica and further characterized via CT on 10/05/23, which confirms that the mass is solid and enhancing with features concerning for RCC.   Hospital Course: Pt was admitted and taken to the OR on 11/30/23 for a RAL left radical nephrectomy.  Pt tolerated the procedure well and was hemodynamically stable throughout.  She was extubated without complication and woke up from anesthesia neurologically intact.  Post op course progressed as expected.  On POD 1 her diet was advance to regular, she ambulated, and was able to void after foley removal.  She was passing flatus and had a small BM on POD 1 as well. Labs and vitals remained stable.  She had good pain control and was felt stable for d/c home on the afternoon of POD 1.  Laboratory values:  Recent Labs    12/01/23 0419  HGB 11.6*  HCT 38.2   Recent Labs    12/01/23 0419  CREATININE 1.91*    Disposition: Home  Discharge instruction: The patient was instructed to be ambulatory but told to refrain from heavy lifting, strenuous activity, or driving.   Discharge medications:  Allergies as of 12/01/2023       Reactions   Iodinated Contrast Media Other (See Comments)   Lightheaded/Dizziness   Latex    Simvastatin Other (See Comments)   Leg pain   Trulicity [dulaglutide] Other (See Comments)   Leg cramps         Medication List     STOP taking these medications    B-12 2500 MCG Subl   CAL MAG ZINC +D3 PO   meloxicam  15 MG tablet Commonly known as: MOBIC    OIL OF OREGANO  PO   OVER THE COUNTER MEDICATION   OVER THE COUNTER MEDICATION       TAKE these medications    cyclobenzaprine  10 MG tablet Commonly known as: FLEXERIL  Take 1 tablet by mouth every 8 hours as needed for muscle spasm or muscle pain   docusate sodium 100 MG capsule Commonly known as: COLACE Take 1 capsule (100 mg total) by mouth 2 (two) times daily.   Farxiga  10 MG Tabs tablet Generic drug: dapagliflozin  propanediol Take 1 tablet (10 mg total) by mouth daily.   freestyle lancets Use 1 lancet to check blood sugar once daily   freestyle lancets Use as directed to test blood sugar   FREESTYLE LITE test strip Generic drug: glucose blood Use 1 test strip to test blood sugar once daily   FREESTYLE LITE test strip Generic drug: glucose blood use as directed to test blood sugar   FreeStyle Lite w/Device Kit Use as directed to check blood sugar   gabapentin  300 MG capsule Commonly known as: NEURONTIN  Take 1 capsule (300 mg total) by mouth 2 (two) times daily. What changed:  when to take this reasons to take this   glipiZIDE  5 MG 24 hr tablet Commonly known as: GLUCOTROL  XL Take 1 tablet (5 mg total) by mouth daily with food.   HYDROcodone -acetaminophen  5-325 MG tablet Commonly known as: NORCO/VICODIN Take 1-2 tablets by mouth  every 6 (six) hours as needed for moderate pain (pain score 4-6) or severe pain (pain score 7-10).   Janumet  XR 50-500 MG Tb24 Generic drug: SitaGLIPtin -MetFORMIN  HCl Take 1 tablet by mouth 2 (two) times daily with a meal.   lisdexamfetamine 60 MG capsule Commonly known as: Vyvanse  Take 1 capsule (60 mg total) by mouth every morning.   lisdexamfetamine 60 MG capsule Commonly known as: Vyvanse  Take 1 capsule (60 mg total) by mouth in the morning.   losartan -hydrochlorothiazide  100-25 MG tablet Commonly known as: HYZAAR  Take 1 tablet by mouth in the morning.   meclizine  25 MG tablet Commonly known as: ANTIVERT  Take 1 tablet (25 mg  total) by mouth 3 (three) times daily as needed   Nurtec 75 MG Tbdp Generic drug: Rimegepant Sulfate  Take 1 tablet (75 mg total) by mouth daily as needed for migraine   Nurtec 75 MG Tbdp Generic drug: Rimegepant Sulfate  Dissolve 1 tablet once a day as needed for migraine   rosuvastatin  10 MG tablet Commonly known as: CRESTOR  Take 1 tablet (10 mg total) by mouth 2 (two) times a week.   spironolactone  25 MG tablet Commonly known as: ALDACTONE  Take 1 tablet (25 mg total) by mouth daily.        Followup:   Follow-up Information     Devere Lonni Righter, MD Follow up on 12/13/2023.   Specialty: Urology Why: at 3:00 Contact information: [address] [place] [address] [place] [address] [phone]

## 2023-12-01 NOTE — Progress Notes (Signed)
   12/01/23 0935  TOC Brief Assessment  Insurance and Status Reviewed  Patient has primary care physician Yes  Home environment has been reviewed Resides in single family home with spouse and child(ren)  Prior level of function: Independent with ADLs at baseline  Prior/Current Home Services No current home services  Social Drivers of Health Review SDOH reviewed no interventions necessary  Readmission risk has been reviewed Yes  Transition of care needs no transition of care needs at this time

## 2023-12-01 NOTE — Progress Notes (Signed)
 AVS and discharge instructions reviewed w/ patient. Patient verbalized understanding.

## 2023-12-01 NOTE — Progress Notes (Signed)
 Discharge medications delivered to patient at [facility] in a secure bag.

## 2023-12-03 ENCOUNTER — Other Ambulatory Visit (HOSPITAL_COMMUNITY): Payer: Self-pay

## 2023-12-10 DIAGNOSIS — E119 Type 2 diabetes mellitus without complications: Secondary | ICD-10-CM | POA: Diagnosis not present

## 2023-12-11 ENCOUNTER — Other Ambulatory Visit (HOSPITAL_COMMUNITY): Payer: Self-pay

## 2023-12-11 MED ORDER — JANUVIA 50 MG PO TABS
50.0000 mg | ORAL_TABLET | Freq: Every day | ORAL | 0 refills | Status: AC
  Filled 2023-12-11 – 2023-12-13 (×2): qty 30, 30d supply, fill #0
  Filled 2024-02-09: qty 30, 30d supply, fill #1
  Filled 2024-02-21: qty 30, 30d supply, fill #2

## 2023-12-11 MED ORDER — GLIPIZIDE ER 10 MG PO TB24
10.0000 mg | ORAL_TABLET | Freq: Every day | ORAL | 0 refills | Status: AC
  Filled 2023-12-11 – 2023-12-13 (×2): qty 30, 30d supply, fill #0
  Filled 2024-02-08: qty 30, 30d supply, fill #1

## 2023-12-13 ENCOUNTER — Other Ambulatory Visit (HOSPITAL_COMMUNITY): Payer: Self-pay

## 2023-12-13 ENCOUNTER — Other Ambulatory Visit: Payer: Self-pay

## 2024-01-07 DIAGNOSIS — E1169 Type 2 diabetes mellitus with other specified complication: Secondary | ICD-10-CM | POA: Diagnosis not present

## 2024-01-07 DIAGNOSIS — N1832 Chronic kidney disease, stage 3b: Secondary | ICD-10-CM | POA: Diagnosis not present

## 2024-01-11 DIAGNOSIS — R399 Unspecified symptoms and signs involving the genitourinary system: Secondary | ICD-10-CM | POA: Diagnosis not present

## 2024-02-07 DIAGNOSIS — I10 Essential (primary) hypertension: Secondary | ICD-10-CM | POA: Diagnosis not present

## 2024-02-07 DIAGNOSIS — E7800 Pure hypercholesterolemia, unspecified: Secondary | ICD-10-CM | POA: Diagnosis not present

## 2024-02-07 DIAGNOSIS — Z0000 Encounter for general adult medical examination without abnormal findings: Secondary | ICD-10-CM | POA: Diagnosis not present

## 2024-02-07 DIAGNOSIS — E119 Type 2 diabetes mellitus without complications: Secondary | ICD-10-CM | POA: Diagnosis not present

## 2024-02-08 ENCOUNTER — Other Ambulatory Visit: Payer: Self-pay

## 2024-02-08 ENCOUNTER — Other Ambulatory Visit (HOSPITAL_COMMUNITY): Payer: Self-pay

## 2024-02-08 ENCOUNTER — Other Ambulatory Visit: Payer: Self-pay | Admitting: Family Medicine

## 2024-02-08 DIAGNOSIS — Z1231 Encounter for screening mammogram for malignant neoplasm of breast: Secondary | ICD-10-CM | POA: Diagnosis not present

## 2024-02-08 DIAGNOSIS — E7800 Pure hypercholesterolemia, unspecified: Secondary | ICD-10-CM | POA: Diagnosis not present

## 2024-02-08 DIAGNOSIS — Z905 Acquired absence of kidney: Secondary | ICD-10-CM | POA: Diagnosis not present

## 2024-02-08 DIAGNOSIS — Z85528 Personal history of other malignant neoplasm of kidney: Secondary | ICD-10-CM | POA: Diagnosis not present

## 2024-02-08 DIAGNOSIS — Z23 Encounter for immunization: Secondary | ICD-10-CM | POA: Diagnosis not present

## 2024-02-08 DIAGNOSIS — F900 Attention-deficit hyperactivity disorder, predominantly inattentive type: Secondary | ICD-10-CM | POA: Diagnosis not present

## 2024-02-08 DIAGNOSIS — Z0000 Encounter for general adult medical examination without abnormal findings: Secondary | ICD-10-CM | POA: Diagnosis not present

## 2024-02-08 DIAGNOSIS — N184 Chronic kidney disease, stage 4 (severe): Secondary | ICD-10-CM | POA: Diagnosis not present

## 2024-02-08 DIAGNOSIS — E119 Type 2 diabetes mellitus without complications: Secondary | ICD-10-CM | POA: Diagnosis not present

## 2024-02-08 DIAGNOSIS — I10 Essential (primary) hypertension: Secondary | ICD-10-CM | POA: Diagnosis not present

## 2024-02-09 ENCOUNTER — Other Ambulatory Visit (HOSPITAL_COMMUNITY): Payer: Self-pay

## 2024-02-10 ENCOUNTER — Ambulatory Visit
Admission: RE | Admit: 2024-02-10 | Discharge: 2024-02-10 | Disposition: A | Discharge status: Home / Self Care | Source: Ambulatory Visit | Attending: Family Medicine | Admitting: Family Medicine

## 2024-02-10 ENCOUNTER — Other Ambulatory Visit (HOSPITAL_COMMUNITY): Payer: Self-pay

## 2024-02-10 DIAGNOSIS — Z1231 Encounter for screening mammogram for malignant neoplasm of breast: Secondary | ICD-10-CM

## 2024-02-10 MED ORDER — LOSARTAN POTASSIUM-HCTZ 100-25 MG PO TABS
1.0000 | ORAL_TABLET | Freq: Every morning | ORAL | 0 refills | Status: DC
  Filled 2024-02-10: qty 90, 90d supply, fill #0

## 2024-02-11 ENCOUNTER — Other Ambulatory Visit (HOSPITAL_COMMUNITY): Payer: Self-pay

## 2024-02-11 DIAGNOSIS — Z85528 Personal history of other malignant neoplasm of kidney: Secondary | ICD-10-CM | POA: Diagnosis not present

## 2024-02-11 DIAGNOSIS — M1612 Unilateral primary osteoarthritis, left hip: Secondary | ICD-10-CM | POA: Diagnosis not present

## 2024-02-11 DIAGNOSIS — M545 Low back pain, unspecified: Secondary | ICD-10-CM | POA: Diagnosis not present

## 2024-02-11 DIAGNOSIS — M51362 Other intervertebral disc degeneration, lumbar region with discogenic back pain and lower extremity pain: Secondary | ICD-10-CM | POA: Diagnosis not present

## 2024-02-11 DIAGNOSIS — M5416 Radiculopathy, lumbar region: Secondary | ICD-10-CM | POA: Diagnosis not present

## 2024-02-11 DIAGNOSIS — M25552 Pain in left hip: Secondary | ICD-10-CM | POA: Diagnosis not present

## 2024-02-11 DIAGNOSIS — M25652 Stiffness of left hip, not elsewhere classified: Secondary | ICD-10-CM | POA: Diagnosis not present

## 2024-02-11 MED ORDER — ROSUVASTATIN CALCIUM 10 MG PO TABS
ORAL_TABLET | ORAL | 0 refills | Status: DC
  Filled 2024-02-11: qty 36, 84d supply, fill #0

## 2024-02-14 ENCOUNTER — Other Ambulatory Visit (HOSPITAL_COMMUNITY): Payer: Self-pay

## 2024-02-14 DIAGNOSIS — M1612 Unilateral primary osteoarthritis, left hip: Secondary | ICD-10-CM

## 2024-02-14 DIAGNOSIS — M25652 Stiffness of left hip, not elsewhere classified: Secondary | ICD-10-CM

## 2024-02-14 DIAGNOSIS — M25552 Pain in left hip: Secondary | ICD-10-CM

## 2024-02-14 DIAGNOSIS — C649 Malignant neoplasm of unspecified kidney, except renal pelvis: Secondary | ICD-10-CM | POA: Diagnosis not present

## 2024-02-17 ENCOUNTER — Other Ambulatory Visit: Payer: Self-pay | Admitting: Family Medicine

## 2024-02-17 ENCOUNTER — Ambulatory Visit (HOSPITAL_COMMUNITY): Admission: RE | Admit: 2024-02-17 | Source: Ambulatory Visit

## 2024-02-17 DIAGNOSIS — M25552 Pain in left hip: Secondary | ICD-10-CM | POA: Diagnosis not present

## 2024-02-17 DIAGNOSIS — M25652 Stiffness of left hip, not elsewhere classified: Secondary | ICD-10-CM | POA: Diagnosis not present

## 2024-02-17 DIAGNOSIS — M1612 Unilateral primary osteoarthritis, left hip: Secondary | ICD-10-CM | POA: Insufficient documentation

## 2024-02-17 DIAGNOSIS — R928 Other abnormal and inconclusive findings on diagnostic imaging of breast: Secondary | ICD-10-CM

## 2024-02-21 ENCOUNTER — Other Ambulatory Visit (HOSPITAL_COMMUNITY): Payer: Self-pay

## 2024-02-22 ENCOUNTER — Other Ambulatory Visit (HOSPITAL_COMMUNITY): Payer: Self-pay

## 2024-02-22 MED ORDER — LISDEXAMFETAMINE DIMESYLATE 60 MG PO CAPS
60.0000 mg | ORAL_CAPSULE | ORAL | 0 refills | Status: AC
  Filled 2024-02-22: qty 90, 90d supply, fill #0

## 2024-02-23 ENCOUNTER — Other Ambulatory Visit: Payer: Self-pay

## 2024-02-23 ENCOUNTER — Other Ambulatory Visit (HOSPITAL_COMMUNITY): Payer: Self-pay

## 2024-02-23 MED ORDER — DAPAGLIFLOZIN PROPANEDIOL 10 MG PO TABS
10.0000 mg | ORAL_TABLET | Freq: Every day | ORAL | 0 refills | Status: AC
  Filled 2024-02-23 (×3): qty 90, 90d supply, fill #0

## 2024-02-23 MED ORDER — ROSUVASTATIN CALCIUM 10 MG PO TABS: 10.0000 mg | ORAL_TABLET | ORAL | 0 refills | Status: AC

## 2024-02-23 MED ORDER — LOSARTAN POTASSIUM-HCTZ 100-25 MG PO TABS
1.0000 | ORAL_TABLET | Freq: Every morning | ORAL | 0 refills | Status: AC
  Filled 2024-02-23: qty 90, 90d supply, fill #0

## 2024-03-01 ENCOUNTER — Ambulatory Visit
Admission: RE | Admit: 2024-03-01 | Discharge: 2024-03-01 | Disposition: A | Discharge status: Home / Self Care | Source: Ambulatory Visit | Attending: Family Medicine | Admitting: Family Medicine

## 2024-03-01 DIAGNOSIS — R928 Other abnormal and inconclusive findings on diagnostic imaging of breast: Secondary | ICD-10-CM

## 2024-03-03 ENCOUNTER — Other Ambulatory Visit: Payer: Self-pay | Admitting: Family Medicine

## 2024-03-03 ENCOUNTER — Other Ambulatory Visit: Payer: Self-pay

## 2024-03-03 DIAGNOSIS — R928 Other abnormal and inconclusive findings on diagnostic imaging of breast: Secondary | ICD-10-CM

## 2024-03-05 ENCOUNTER — Other Ambulatory Visit (HOSPITAL_COMMUNITY): Payer: Self-pay
